# Patient Record
Sex: Female | Born: 1965 | Race: Black or African American | Hispanic: No | Marital: Single | Smoking: Former smoker
Health system: Southern US, Community
[De-identification: ages and names within clinical notes are randomized; demographics above are authoritative.]

## PROBLEM LIST (undated history)

## (undated) DIAGNOSIS — N938 Other specified abnormal uterine and vaginal bleeding: Secondary | ICD-10-CM

## (undated) DIAGNOSIS — G43909 Migraine, unspecified, not intractable, without status migrainosus: Secondary | ICD-10-CM

## (undated) DIAGNOSIS — R1110 Vomiting, unspecified: Secondary | ICD-10-CM

## (undated) HISTORY — DX: Migraine, unspecified, not intractable, without status migrainosus: G43.909

## (undated) HISTORY — PX: NO PAST SURGERIES: SHX2092

## (undated) HISTORY — PX: ENDOMETRIAL ABLATION: SHX621

## (undated) HISTORY — DX: Other specified abnormal uterine and vaginal bleeding: N93.8

---

## 2011-01-16 ENCOUNTER — Emergency Department (HOSPITAL_COMMUNITY)
Admission: EM | Admit: 2011-01-16 | Discharge: 2011-01-16 | Payer: Self-pay | Source: Home / Self Care | Admitting: Emergency Medicine

## 2011-01-16 LAB — DIFFERENTIAL
Eosinophils Relative: 0 % (ref 0–5)
Lymphocytes Relative: 11 % — ABNORMAL LOW (ref 12–46)
Monocytes Absolute: 0.8 10*3/uL (ref 0.1–1.0)
Monocytes Relative: 7 % (ref 3–12)
Neutrophils Relative %: 82 % — ABNORMAL HIGH (ref 43–77)

## 2011-01-16 LAB — POCT I-STAT, CHEM 8
BUN: 10 mg/dL (ref 6–23)
Chloride: 104 mEq/L (ref 96–112)
Creatinine, Ser: 0.9 mg/dL (ref 0.4–1.2)
Hemoglobin: 14.3 g/dL (ref 12.0–15.0)
Potassium: 3.6 mEq/L (ref 3.5–5.1)
Sodium: 139 mEq/L (ref 135–145)

## 2011-01-16 LAB — URINALYSIS, ROUTINE W REFLEX MICROSCOPIC
Bilirubin Urine: NEGATIVE
Hgb urine dipstick: NEGATIVE
Ketones, ur: NEGATIVE mg/dL
Protein, ur: NEGATIVE mg/dL
Urobilinogen, UA: 0.2 mg/dL (ref 0.0–1.0)

## 2011-01-16 LAB — CBC
Hemoglobin: 12.4 g/dL (ref 12.0–15.0)
MCV: 66.3 fL — ABNORMAL LOW (ref 78.0–100.0)
Platelets: 256 10*3/uL (ref 150–400)
RBC: 5.4 MIL/uL — ABNORMAL HIGH (ref 3.87–5.11)
WBC: 11.2 10*3/uL — ABNORMAL HIGH (ref 4.0–10.5)

## 2011-01-16 LAB — WET PREP, GENITAL: WBC, Wet Prep HPF POC: NONE SEEN

## 2011-01-17 LAB — GC/CHLAMYDIA PROBE AMP, GENITAL: Chlamydia, DNA Probe: NEGATIVE

## 2014-06-15 ENCOUNTER — Ambulatory Visit: Payer: Self-pay | Attending: Internal Medicine

## 2014-07-23 ENCOUNTER — Other Ambulatory Visit (HOSPITAL_COMMUNITY)
Admission: RE | Admit: 2014-07-23 | Discharge: 2014-07-23 | Disposition: A | Discharge status: Home / Self Care | Payer: No Typology Code available for payment source | Source: Ambulatory Visit | Attending: Internal Medicine | Admitting: Internal Medicine

## 2014-07-23 ENCOUNTER — Encounter: Payer: Self-pay | Admitting: Internal Medicine

## 2014-07-23 ENCOUNTER — Ambulatory Visit: Payer: No Typology Code available for payment source | Attending: Internal Medicine | Admitting: Internal Medicine

## 2014-07-23 VITALS — BP 117/74 | HR 85 | Temp 98.2°F | Resp 14 | Ht 62.0 in | Wt 117.4 lb

## 2014-07-23 DIAGNOSIS — R112 Nausea with vomiting, unspecified: Secondary | ICD-10-CM | POA: Insufficient documentation

## 2014-07-23 DIAGNOSIS — Z1272 Encounter for screening for malignant neoplasm of vagina: Secondary | ICD-10-CM | POA: Insufficient documentation

## 2014-07-23 DIAGNOSIS — Z0000 Encounter for general adult medical examination without abnormal findings: Secondary | ICD-10-CM

## 2014-07-23 DIAGNOSIS — Z01419 Encounter for gynecological examination (general) (routine) without abnormal findings: Secondary | ICD-10-CM | POA: Insufficient documentation

## 2014-07-23 DIAGNOSIS — N760 Acute vaginitis: Secondary | ICD-10-CM | POA: Insufficient documentation

## 2014-07-23 DIAGNOSIS — Z113 Encounter for screening for infections with a predominantly sexual mode of transmission: Secondary | ICD-10-CM | POA: Insufficient documentation

## 2014-07-23 DIAGNOSIS — Z87891 Personal history of nicotine dependence: Secondary | ICD-10-CM | POA: Insufficient documentation

## 2014-07-23 DIAGNOSIS — Z124 Encounter for screening for malignant neoplasm of cervix: Secondary | ICD-10-CM

## 2014-07-23 DIAGNOSIS — N926 Irregular menstruation, unspecified: Secondary | ICD-10-CM | POA: Insufficient documentation

## 2014-07-23 DIAGNOSIS — Z888 Allergy status to other drugs, medicaments and biological substances status: Secondary | ICD-10-CM | POA: Insufficient documentation

## 2014-07-23 DIAGNOSIS — Z1151 Encounter for screening for human papillomavirus (HPV): Secondary | ICD-10-CM | POA: Insufficient documentation

## 2014-07-23 LAB — POCT URINALYSIS DIPSTICK
Bilirubin, UA: NEGATIVE
GLUCOSE UA: NEGATIVE
Ketones, UA: NEGATIVE
LEUKOCYTES UA: NEGATIVE
Nitrite, UA: NEGATIVE
PROTEIN UA: NEGATIVE
SPEC GRAV UA: 1.025
UROBILINOGEN UA: 0.2
pH, UA: 5.5

## 2014-07-23 MED ORDER — ONDANSETRON 4 MG PO TBDP: 4.0000 mg | ORAL_TABLET | Freq: Three times a day (TID) | ORAL | Status: DC | PRN

## 2014-07-23 NOTE — Progress Notes (Signed)
Patient presents to establish care Requesting pap and breast exam States it's been at least 5 years since last pap. Denies history of abnormal pap C/O 2 year history of nausea and vomiting lasting 5-7 days during each month's cycle

## 2014-07-23 NOTE — Progress Notes (Signed)
Patient ID: Laura Guzman, female   DOB: 08/26/1966, 48 y.o.   MRN: [id number]   Laura Guzman, is a 48 y.o. female  CVE:[id number]  [id number]:[id number]  DOB - 1966-03-18  CC: "Wants pap and breast exam." Chief Complaint  Patient presents with  . Establish Care  . Gynecologic Exam       HPI: Laura Guzman is a 48 y.o. female here today to establish medical care. Laura Guzman presents for a pap-smear and breast exam. Patient reports a total of 3-4 paps last one was five years ago and all were normal. Patient reports having multiple cycles per month. Recently states having brown discharge with night sweats and hot flashes. Patient reports not being able to eat or drink for 5-7 days of her cycle with severe nausea and vomiting and weight loss. Patient is unable to give exact information but reports she used to be a size 9 and is now a size 3-5. No cramps or clotting or pain. No current birth control wants Depo-provera. Last sexual intercourse was November or December 2014. Past medical history of IBS, Crohns, diverticulitis, and multiple endoscopies. Reports father is in good health and mother has hypertension.  Allergies  Allergen Reactions  . Compazine [Prochlorperazine Edisylate] Swelling    States "jaw locked down"   History reviewed. No pertinent past medical history. No current outpatient prescriptions on file prior to visit.   No current facility-administered medications on file prior to visit.   Family History  Problem Relation Age of Onset  . Hypertension Mother   . Hyperlipidemia Mother    History   Social History  . Marital Status: Married    Spouse Name: N/A    Number of Children: N/A  . Years of Education: N/A   Occupational History  . Not on file.   Social History Main Topics  . Smoking status: Former Smoker -- 0.20 packs/day for 1 years    Types: Cigarettes    Start date: 07/23/2012    Quit date: 07/23/2013  . Smokeless tobacco: Not on file  . Alcohol Use:  Not on file  . Drug Use: Not on file  . Sexual Activity: Not on file   Other Topics Concern  . Not on file   Social History Narrative  . No narrative on file    Review of Systems  Constitutional: Positive for weight loss. Negative for fever, chills and malaise/fatigue.  HENT: Negative.   Eyes: Negative.   Respiratory: Negative.   Cardiovascular: Negative.   Gastrointestinal: Positive for nausea, vomiting and diarrhea. Negative for constipation.  Genitourinary: Negative.   Musculoskeletal: Negative.   Skin: Negative.   Neurological: Negative.   Endo/Heme/Allergies: Negative.   Psychiatric/Behavioral: The patient has insomnia.      Objective:   Filed Vitals:   07/23/14 1002  BP: 117/74  Pulse: 85  Temp: 98.2 F (36.8 C)  Resp: 14   Physical Exam  Constitutional: She is oriented to person, place, and time. Vital signs are normal. She appears well-developed and well-nourished.  HENT:  Head: Normocephalic.  Right Ear: Tympanic membrane, external ear and ear canal normal.  Left Ear: Tympanic membrane, external ear and ear canal normal.  Mouth/Throat: Uvula is midline and oropharynx is clear and moist.  Eyes: Conjunctivae, EOM and lids are normal. Pupils are equal, round, and reactive to light.  Neck: No thyromegaly present.  Cardiovascular: Normal rate, regular rhythm, S1 normal, S2 normal and normal heart sounds.   Pulmonary/Chest: Effort normal and breath sounds normal. Right  breast exhibits no inverted nipple, no mass, no nipple discharge, no skin change and no tenderness. Left breast exhibits no inverted nipple, no mass, no nipple discharge, no skin change and no tenderness. Breasts are symmetrical. There is no breast swelling.  Abdominal: Soft. Bowel sounds are normal.  Genitourinary: No breast bleeding.  Musculoskeletal: Normal range of motion.  Lymphadenopathy:       Head (right side): No submental, no submandibular, no tonsillar, no preauricular, no posterior  auricular and no occipital adenopathy present.       Head (left side): No submental, no submandibular, no tonsillar, no preauricular, no posterior auricular and no occipital adenopathy present.       Right cervical: No superficial cervical, no deep cervical and no posterior cervical adenopathy present.      Left cervical: No superficial cervical, no deep cervical and no posterior cervical adenopathy present.  Neurological: She is alert and oriented to person, place, and time. She has normal strength and normal reflexes. No cranial nerve deficit.  Skin: Skin is warm, dry and intact.  Psychiatric: She has a normal mood and affect. Her speech is normal and behavior is normal. Judgment and thought content normal. Cognition and memory are normal.     Lab Results  Component Value Date   WBC 11.2* 01/16/2011   HGB 14.3 01/16/2011   HCT 42.0 01/16/2011   MCV 66.3* 01/16/2011   PLT 256 01/16/2011   Lab Results  Component Value Date   CREATININE 0.9 01/16/2011   BUN 10 01/16/2011   NA 139 01/16/2011   K 3.6 01/16/2011   CL 104 01/16/2011    No results found for this basename: HGBA1C   Lipid Panel  No results found for this basename: chol, trig, hdl, cholhdl, vldl, ldlcalc       Assessment and plan:   Laura Guzman was seen today for establish care and gynecologic exam.  Diagnoses and associated orders for this visit:  Annual physical exam - Urinalysis Dipstick - MM Digital Screening; Future - Lipid panel; Future - CBC; Future - COMPLETE METABOLIC PANEL WITH GFR; Future - TSH; Future - Vitamin D, 25-hydroxy; Future - HIV antibody (with reflex); Future - RPR; Future  Papanicolaou smear - Cytology - PAP [place] - Cervicovaginal ancillary only - Cancel: HIV antibody (with reflex) - Cancel: RPR  Non-intractable vomiting with nausea, vomiting of unspecified type - ondansetron (ZOFRAN ODT) 4 MG disintegrating tablet; Take 1 tablet (4 mg total) by mouth every 8 (eight) hours as needed  for nausea or vomiting.  Irregular menstrual cycle - Ambulatory referral to Gynecology    Laura Guzman, [facility] [phone] 08/11/2014, 11:05 PM

## 2014-07-23 NOTE — Patient Instructions (Signed)

## 2014-07-24 LAB — CYTOLOGY - PAP

## 2014-07-27 ENCOUNTER — Other Ambulatory Visit: Payer: Self-pay | Admitting: Internal Medicine

## 2014-07-27 DIAGNOSIS — Z1231 Encounter for screening mammogram for malignant neoplasm of breast: Secondary | ICD-10-CM

## 2014-07-28 ENCOUNTER — Telehealth: Payer: Self-pay | Admitting: *Deleted

## 2014-07-28 ENCOUNTER — Telehealth: Payer: Self-pay | Admitting: Emergency Medicine

## 2014-07-28 MED ORDER — METRONIDAZOLE 500 MG PO TABS: 500.0000 mg | ORAL_TABLET | Freq: Two times a day (BID) | ORAL | Status: DC

## 2014-07-28 NOTE — Telephone Encounter (Signed)
Message copied by Ricci Barker on Tue Jul 28, 2014  4:45 PM ------      Message from: Chari Manning A      Created: Fri Jul 24, 2014  5:58 PM       Patient is positive for BV. Explain it is not a STD and should not drink alcohol while on this medication.  Please send script for metronidazole 500 mg BID for 7 days. No refills. Thanks ------

## 2014-07-28 NOTE — Telephone Encounter (Signed)
Pt given lab results with medication instructions Flagyl 500 mg BID x 7 dys and informed not to drink alcohol while on medication

## 2014-08-03 ENCOUNTER — Ambulatory Visit: Payer: No Typology Code available for payment source | Attending: Internal Medicine

## 2014-08-03 DIAGNOSIS — Z0000 Encounter for general adult medical examination without abnormal findings: Secondary | ICD-10-CM

## 2014-08-03 LAB — CBC
HEMATOCRIT: 37.4 % (ref 36.0–46.0)
HEMOGLOBIN: 12.1 g/dL (ref 12.0–15.0)
MCH: 22.6 pg — AB (ref 26.0–34.0)
MCHC: 32.4 g/dL (ref 30.0–36.0)
MCV: 69.8 fL — AB (ref 78.0–100.0)
Platelets: 273 10*3/uL (ref 150–400)
RBC: 5.36 MIL/uL — AB (ref 3.87–5.11)
RDW: 16.7 % — ABNORMAL HIGH (ref 11.5–15.5)
WBC: 5 10*3/uL (ref 4.0–10.5)

## 2014-08-03 LAB — COMPLETE METABOLIC PANEL WITH GFR
ALT: 21 U/L (ref 0–35)
AST: 21 U/L (ref 0–37)
Albumin: 4.2 g/dL (ref 3.5–5.2)
Alkaline Phosphatase: 61 U/L (ref 39–117)
BILIRUBIN TOTAL: 0.2 mg/dL (ref 0.2–1.2)
BUN: 13 mg/dL (ref 6–23)
CALCIUM: 9.1 mg/dL (ref 8.4–10.5)
CHLORIDE: 105 meq/L (ref 96–112)
CO2: 28 meq/L (ref 19–32)
CREATININE: 0.73 mg/dL (ref 0.50–1.10)
GLUCOSE: 84 mg/dL (ref 70–99)
Potassium: 4.4 mEq/L (ref 3.5–5.3)
Sodium: 140 mEq/L (ref 135–145)
Total Protein: 6.4 g/dL (ref 6.0–8.3)

## 2014-08-03 LAB — LIPID PANEL
CHOL/HDL RATIO: 2.5 ratio
CHOLESTEROL: 194 mg/dL (ref 0–200)
HDL: 79 mg/dL (ref >39)
LDL Cholesterol: 102 mg/dL — ABNORMAL HIGH (ref 0–99)
Triglycerides: 63 mg/dL (ref <150)
VLDL: 13 mg/dL (ref 0–40)

## 2014-08-04 ENCOUNTER — Telehealth: Payer: Self-pay | Admitting: *Deleted

## 2014-08-04 LAB — RPR

## 2014-08-04 LAB — HIV ANTIBODY (ROUTINE TESTING W REFLEX): HIV: NONREACTIVE

## 2014-08-04 LAB — VITAMIN D 25 HYDROXY (VIT D DEFICIENCY, FRACTURES): VIT D 25 HYDROXY: 15 ng/mL — AB (ref 30–89)

## 2014-08-04 LAB — TSH: TSH: 1.772 u[IU]/mL (ref 0.350–4.500)

## 2014-08-04 NOTE — Telephone Encounter (Signed)
Message copied by Velora Heckler on Tue Aug 04, 2014  5:38 PM ------      Message from: Chari Manning A      Created: Tue Jul 28, 2014 10:36 AM       Pap negative for malignancy. Will repeat in 3 years ------

## 2014-08-05 ENCOUNTER — Ambulatory Visit: Payer: No Typology Code available for payment source

## 2014-08-06 ENCOUNTER — Ambulatory Visit
Admission: RE | Admit: 2014-08-06 | Discharge: 2014-08-06 | Disposition: A | Discharge status: Home / Self Care | Payer: No Typology Code available for payment source | Source: Ambulatory Visit | Attending: Internal Medicine | Admitting: Internal Medicine

## 2014-08-06 ENCOUNTER — Telehealth: Payer: Self-pay | Admitting: Emergency Medicine

## 2014-08-06 DIAGNOSIS — Z1231 Encounter for screening mammogram for malignant neoplasm of breast: Secondary | ICD-10-CM

## 2014-08-06 MED ORDER — FERROUS SULFATE 325 (65 FE) MG PO TABS: 325.0000 mg | ORAL_TABLET | Freq: Every day | ORAL | Status: DC

## 2014-08-06 MED ORDER — VITAMIN D (ERGOCALCIFEROL) 1.25 MG (50000 UNIT) PO CAPS: 50000.0000 [IU] | ORAL_CAPSULE | ORAL | Status: DC

## 2014-08-06 NOTE — Telephone Encounter (Signed)
Pt given lab results with medications instructions to start taking Ferrous Sulfate 325 mg tab, Vitamin D 50,000 units weekly Pt given negative HIV/RPR results

## 2014-08-06 NOTE — Telephone Encounter (Signed)
Message copied by Ricci Barker on Thu Aug 06, 2014  2:03 PM ------      Message from: Chari Manning A      Created: Tue Aug 04, 2014 11:23 PM       Please send patient ferrous sulfate 325 mg QD with meal. Please send drisdol 50,000 IU to take once weekly for 12 weeks, no refills.  HIV/RPR is negative ------

## 2014-08-11 ENCOUNTER — Encounter: Payer: Self-pay | Admitting: Internal Medicine

## 2014-08-12 ENCOUNTER — Telehealth: Payer: Self-pay | Admitting: *Deleted

## 2014-08-12 NOTE — Telephone Encounter (Signed)
Pt is aware of her MM results. 

## 2014-08-12 NOTE — Telephone Encounter (Signed)
Message copied by Joan Mayans on Wed Aug 12, 2014  9:32 AM ------      Message from: Chari Manning A      Created: Tue Aug 11, 2014 12:56 PM       Mammogram is normal ------

## 2014-08-17 ENCOUNTER — Encounter: Payer: Self-pay | Admitting: Obstetrics and Gynecology

## 2014-10-02 ENCOUNTER — Ambulatory Visit (INDEPENDENT_AMBULATORY_CARE_PROVIDER_SITE_OTHER): Payer: Self-pay | Admitting: Obstetrics and Gynecology

## 2014-10-02 ENCOUNTER — Encounter: Payer: Self-pay | Admitting: Obstetrics and Gynecology

## 2014-10-02 ENCOUNTER — Other Ambulatory Visit (HOSPITAL_COMMUNITY)
Admission: RE | Admit: 2014-10-02 | Discharge: 2014-10-02 | Disposition: A | Discharge status: Home / Self Care | Payer: Self-pay | Source: Ambulatory Visit | Attending: Obstetrics and Gynecology | Admitting: Obstetrics and Gynecology

## 2014-10-02 VITALS — BP 125/63 | HR 62 | Temp 99.4°F | Ht 62.0 in | Wt 120.0 lb

## 2014-10-02 DIAGNOSIS — N938 Other specified abnormal uterine and vaginal bleeding: Secondary | ICD-10-CM | POA: Insufficient documentation

## 2014-10-02 LAB — POCT PREGNANCY, URINE: Preg Test, Ur: NEGATIVE

## 2014-10-02 NOTE — Progress Notes (Signed)
Patient ID: Laura Guzman, female   DOB: [birth year], 48 y.o.   MRN: [id number] 48 yo G3P2012 with LMP 09/17/2014 presenting for the evaluation of abnormal uterine bleeding associated with nausea/emesis. Patient reports skipping August and September and often having 2 periods a month. She describes her periods as lasting 5-7 days without being overly excessive and without passage of clots. She reports hot flushes and night sweats on occasions  History reviewed. No pertinent past medical history. History reviewed. No pertinent past surgical history. Family History  Problem Relation Age of Onset  . Hypertension Mother   . Hyperlipidemia Mother    History  Substance Use Topics  . Smoking status: Former Smoker -- 0.20 packs/day for 1 years    Types: Cigarettes    Start date: 07/23/2012    Quit date: 07/23/2013  . Smokeless tobacco: Never Used  . Alcohol Use: No    GENERAL: Well-developed, well-nourished female in no acute distress.  ABDOMEN: Soft, nontender, nondistended.  PELVIC: Normal external female genitalia. Vagina is pink and rugated.  Normal discharge. Normal appearing cervix. Uterus is normal in size. No adnexal mass or tenderness. EXTREMITIES: No cyanosis, clubbing, or edema, 2+ distal pulses.  A/P 48 yo [id number] with abnormal uterine bleeding Endometrial biopsy performed ENDOMETRIAL BIOPSY     The indications for endometrial biopsy were reviewed.   Risks of the biopsy including cramping, bleeding, infection, uterine perforation, inadequate specimen and need for additional procedures  were discussed. The patient states she understands and agrees to undergo procedure today. Consent was signed. Time out was performed. Urine HCG was negative. A sterile speculum was placed in the patient's vagina and the cervix was prepped with Betadine. A single-toothed tenaculum was placed on the anterior lip of the cervix to stabilize it. The uterine cavity was sounded to a depth of 7 cm using the  uterine sound. The 3 mm pipelle was introduced into the endometrial cavity without difficulty, 2 passes were made.  A  moderate amount of tissue was  sent to pathology. The instruments were removed from the patient's vagina. Minimal bleeding from the cervix was noted. The patient tolerated the procedure well.  Routine post-procedure instructions were given to the patient. The patient will follow up in two weeks to review the results and for further management.   -Discussed medical management with Depo-provera, Mirena IUD or continuous OCP - Patient seems to be inclining towards Depo-provera - RTC in 2 weeks

## 2014-10-02 NOTE — Patient Instructions (Signed)
Medroxyprogesterone injection [Contraceptive] What is this medicine? MEDROXYPROGESTERONE (me DROX ee proe JES te rone) contraceptive injections prevent pregnancy. They provide effective birth control for 3 months. Depo-subQ Provera 104 is also used for treating pain related to endometriosis. This medicine may be used for other purposes; ask your health care provider or pharmacist if you have questions. COMMON BRAND NAME(S): Depo-Provera, Depo-subQ Provera 104 What should I tell my health care provider before I take this medicine? They need to know if you have any of these conditions: -frequently drink alcohol -asthma -blood vessel disease or a history of a blood clot in the lungs or legs -bone disease such as osteoporosis -breast cancer -diabetes -eating disorder (anorexia nervosa or bulimia) -high blood pressure -HIV infection or AIDS -kidney disease -liver disease -mental depression -migraine -seizures (convulsions) -stroke -tobacco smoker -vaginal bleeding -an unusual or allergic reaction to medroxyprogesterone, other hormones, medicines, foods, dyes, or preservatives -pregnant or trying to get pregnant -breast-feeding How should I use this medicine? Depo-Provera Contraceptive injection is given into a muscle. Depo-subQ Provera 104 injection is given under the skin. These injections are given by a health care professional. You must not be pregnant before getting an injection. The injection is usually given during the first 5 days after the start of a menstrual period or 6 weeks after delivery of a baby. Talk to your pediatrician regarding the use of this medicine in children. Special care may be needed. These injections have been used in female children who have started having menstrual periods. Overdosage: If you think you have taken too much of this medicine contact a poison control center or emergency room at once. NOTE: This medicine is only for you. Do not share this medicine  with others. What if I miss a dose? Try not to miss a dose. You must get an injection once every 3 months to maintain birth control. If you cannot keep an appointment, call and reschedule it. If you wait longer than 13 weeks between Depo-Provera contraceptive injections or longer than 14 weeks between Depo-subQ Provera 104 injections, you could get pregnant. Use another method for birth control if you miss your appointment. You may also need a pregnancy test before receiving another injection. What may interact with this medicine? Do not take this medicine with any of the following medications: -bosentan This medicine may also interact with the following medications: -aminoglutethimide -antibiotics or medicines for infections, especially rifampin, rifabutin, rifapentine, and griseofulvin -aprepitant -barbiturate medicines such as phenobarbital or primidone -bexarotene -carbamazepine -medicines for seizures like ethotoin, felbamate, oxcarbazepine, phenytoin, topiramate -modafinil -St. John's wort This list may not describe all possible interactions. Give your health care provider a list of all the medicines, herbs, non-prescription drugs, or dietary supplements you use. Also tell them if you smoke, drink alcohol, or use illegal drugs. Some items may interact with your medicine. What should I watch for while using this medicine? This drug does not protect you against HIV infection (AIDS) or other sexually transmitted diseases. Use of this product may cause you to lose calcium from your bones. Loss of calcium may cause weak bones (osteoporosis). Only use this product for more than 2 years if other forms of birth control are not right for you. The longer you use this product for birth control the more likely you will be at risk for weak bones. Ask your health care professional how you can keep strong bones. You may have a change in bleeding pattern or irregular periods. Many females stop having    periods while taking this drug. If you have received your injections on time, your chance of being pregnant is very low. If you think you may be pregnant, see your health care professional as soon as possible. Tell your health care professional if you want to get pregnant within the next year. The effect of this medicine may last a long time after you get your last injection. What side effects may I notice from receiving this medicine? Side effects that you should report to your doctor or health care professional as soon as possible: -allergic reactions like skin rash, itching or hives, swelling of the face, lips, or tongue -breast tenderness or discharge -breathing problems -changes in vision -depression -feeling faint or lightheaded, falls -fever -pain in the abdomen, chest, groin, or leg -problems with balance, talking, walking -unusually weak or tired -yellowing of the eyes or skin Side effects that usually do not require medical attention (report to your doctor or health care professional if they continue or are bothersome): -acne -fluid retention and swelling -headache -irregular periods, spotting, or absent periods -temporary pain, itching, or skin reaction at site where injected -weight gain This list may not describe all possible side effects. Call your doctor for medical advice about side effects. You may report side effects to FDA at 1-800-FDA-1088. Where should I keep my medicine? This does not apply. The injection will be given to you by a health care professional. NOTE: This sheet is a summary. It may not cover all possible information. If you have questions about this medicine, talk to your doctor, pharmacist, or health care provider.  2015, Elsevier/Gold Standard. (2008-12-25 18:37:56) Levonorgestrel intrauterine device (IUD) What is this medicine? LEVONORGESTREL IUD (LEE voe nor jes trel) is a contraceptive (birth control) device. The device is placed inside the uterus  by a healthcare professional. It is used to prevent pregnancy and can also be used to treat heavy bleeding that occurs during your period. Depending on the device, it can be used for 3 to 5 years. This medicine may be used for other purposes; ask your health care provider or pharmacist if you have questions. COMMON BRAND NAME(S): Verda Cumins What should I tell my health care provider before I take this medicine? They need to know if you have any of these conditions: -abnormal Pap smear -cancer of the breast, uterus, or cervix -diabetes -endometritis -genital or pelvic infection now or in the past -have more than one sexual partner or your partner has more than one partner -heart disease -history of an ectopic or tubal pregnancy -immune system problems -IUD in place -liver disease or tumor -problems with blood clots or take blood-thinners -use intravenous drugs -uterus of unusual shape -vaginal bleeding that has not been explained -an unusual or allergic reaction to levonorgestrel, other hormones, silicone, or polyethylene, medicines, foods, dyes, or preservatives -pregnant or trying to get pregnant -breast-feeding How should I use this medicine? This device is placed inside the uterus by a health care professional. Talk to your pediatrician regarding the use of this medicine in children. Special care may be needed. Overdosage: If you think you have taken too much of this medicine contact a poison control center or emergency room at once. NOTE: This medicine is only for you. Do not share this medicine with others. What if I miss a dose? This does not apply. What may interact with this medicine? Do not take this medicine with any of the following medications: -amprenavir -bosentan -fosamprenavir This medicine may also interact  with the following medications: -aprepitant -barbiturate medicines for inducing sleep or treating seizures -bexarotene -griseofulvin -medicines  to treat seizures like carbamazepine, ethotoin, felbamate, oxcarbazepine, phenytoin, topiramate -modafinil -pioglitazone -rifabutin -rifampin -rifapentine -some medicines to treat HIV infection like atazanavir, indinavir, lopinavir, nelfinavir, tipranavir, ritonavir -St. John's wort -warfarin This list may not describe all possible interactions. Give your health care provider a list of all the medicines, herbs, non-prescription drugs, or dietary supplements you use. Also tell them if you smoke, drink alcohol, or use illegal drugs. Some items may interact with your medicine. What should I watch for while using this medicine? Visit your doctor or health care professional for regular check ups. See your doctor if you or your partner has sexual contact with others, becomes HIV positive, or gets a sexual transmitted disease. This product does not protect you against HIV infection (AIDS) or other sexually transmitted diseases. You can check the placement of the IUD yourself by reaching up to the top of your vagina with clean fingers to feel the threads. Do not pull on the threads. It is a good habit to check placement after each menstrual period. Call your doctor right away if you feel more of the IUD than just the threads or if you cannot feel the threads at all. The IUD may come out by itself. You may become pregnant if the device comes out. If you notice that the IUD has come out use a backup birth control method like condoms and call your health care provider. Using tampons will not change the position of the IUD and are okay to use during your period. What side effects may I notice from receiving this medicine? Side effects that you should report to your doctor or health care professional as soon as possible: -allergic reactions like skin rash, itching or hives, swelling of the face, lips, or tongue -fever, flu-like symptoms -genital sores -high blood pressure -no menstrual period for 6 weeks  during use -pain, swelling, warmth in the leg -pelvic pain or tenderness -severe or sudden headache -signs of pregnancy -stomach cramping -sudden shortness of breath -trouble with balance, talking, or walking -unusual vaginal bleeding, discharge -yellowing of the eyes or skin Side effects that usually do not require medical attention (report to your doctor or health care professional if they continue or are bothersome): -acne -breast pain -change in sex drive or performance -changes in weight -cramping, dizziness, or faintness while the device is being inserted -headache -irregular menstrual bleeding within first 3 to 6 months of use -nausea This list may not describe all possible side effects. Call your doctor for medical advice about side effects. You may report side effects to FDA at 1-800-FDA-1088. Where should I keep my medicine? This does not apply. NOTE: This sheet is a summary. It may not cover all possible information. If you have questions about this medicine, talk to your doctor, pharmacist, or health care provider.  2015, Elsevier/Gold Standard. (2012-01-04 13:54:04)

## 2014-10-08 ENCOUNTER — Telehealth: Payer: Self-pay | Admitting: *Deleted

## 2014-10-08 NOTE — Telephone Encounter (Signed)
Contacted patient and informed of results, encouraged patient to keep follow up visit for DUB.  Pt verbalizes understanding.

## 2014-10-08 NOTE — Telephone Encounter (Signed)
Message copied by Sue Lush on Thu Oct 08, 2014 11:31 AM ------      Message from: CONSTANT, Willow      Created: Thu Oct 08, 2014 10:46 AM       Please inform patient of normal endometrial biopsy results. Patient should keep follow up visit for medical management of her DUB            Thanks            Peggy ------

## 2014-10-19 ENCOUNTER — Encounter: Payer: Self-pay | Admitting: Obstetrics and Gynecology

## 2014-10-26 ENCOUNTER — Ambulatory Visit (INDEPENDENT_AMBULATORY_CARE_PROVIDER_SITE_OTHER): Payer: Self-pay | Admitting: Obstetrics and Gynecology

## 2014-10-26 ENCOUNTER — Encounter: Payer: Self-pay | Admitting: Obstetrics and Gynecology

## 2014-10-26 VITALS — BP 124/71 | HR 77 | Temp 98.5°F | Ht 62.0 in | Wt 125.4 lb

## 2014-10-26 DIAGNOSIS — Z712 Person consulting for explanation of examination or test findings: Secondary | ICD-10-CM

## 2014-10-26 DIAGNOSIS — Z7189 Other specified counseling: Secondary | ICD-10-CM

## 2014-10-26 MED ORDER — NORGESTIMATE-ETH ESTRADIOL 0.25-35 MG-MCG PO TABS: 1.0000 | ORAL_TABLET | Freq: Every day | ORAL | Status: DC

## 2014-10-26 NOTE — Progress Notes (Signed)
Patient ID: Laura Guzman, female   DOB: [birth year], 48 y.o.   MRN: [id number] Patient presents today to discuss the results of her endometrial biopsy. Results reviewed and explained to the patient  Diagnosis Endometrium, biopsy 10/02/2014 - PROLIFERATIVE ENDOMETRIUM. NO HYPERPLASIA OR CARCINOMA.  A/P 48 yo with most likely perimenopausal dysfunctional vaginal bleeding - Discussed medical management with OCP, depo-provera or Mirena IUD Patient opted for OCP as she will be relocating to [place]. She plans on switching to depo-Provera once she establishes care in [place] - rtc prn

## 2014-10-30 ENCOUNTER — Telehealth: Payer: Self-pay | Admitting: *Deleted

## 2014-10-30 NOTE — Telephone Encounter (Addendum)
Pt left message stating that she is having a problem with the prescription that was sent to her pharmacy. She was given 12 refills but the pharmacy will only give her a 3 month supply and [place].  She wants to know what she can do. I returned pt's call and discussed her concern. I advised her that she can have her remaining prescription refills transferred to her new pharmacy once she has moved.  Pt expressed gratitude and voiced understanding

## 2014-12-03 ENCOUNTER — Ambulatory Visit: Payer: No Typology Code available for payment source | Attending: Internal Medicine | Admitting: Internal Medicine

## 2014-12-03 ENCOUNTER — Encounter: Payer: Self-pay | Admitting: Internal Medicine

## 2014-12-03 ENCOUNTER — Ambulatory Visit (HOSPITAL_BASED_OUTPATIENT_CLINIC_OR_DEPARTMENT_OTHER): Payer: No Typology Code available for payment source | Admitting: *Deleted

## 2014-12-03 ENCOUNTER — Other Ambulatory Visit (HOSPITAL_COMMUNITY)
Admission: RE | Admit: 2014-12-03 | Discharge: 2014-12-03 | Disposition: A | Discharge status: Home / Self Care | Payer: No Typology Code available for payment source | Source: Ambulatory Visit | Attending: Internal Medicine | Admitting: Internal Medicine

## 2014-12-03 VITALS — BP 136/90 | HR 88 | Temp 98.4°F | Resp 16 | Ht 62.0 in | Wt 129.0 lb

## 2014-12-03 DIAGNOSIS — N760 Acute vaginitis: Secondary | ICD-10-CM | POA: Insufficient documentation

## 2014-12-03 DIAGNOSIS — Z23 Encounter for immunization: Secondary | ICD-10-CM

## 2014-12-03 DIAGNOSIS — Z113 Encounter for screening for infections with a predominantly sexual mode of transmission: Secondary | ICD-10-CM | POA: Insufficient documentation

## 2014-12-03 DIAGNOSIS — Z87891 Personal history of nicotine dependence: Secondary | ICD-10-CM | POA: Insufficient documentation

## 2014-12-03 DIAGNOSIS — N938 Other specified abnormal uterine and vaginal bleeding: Secondary | ICD-10-CM | POA: Insufficient documentation

## 2014-12-03 LAB — POCT URINALYSIS DIPSTICK
BILIRUBIN UA: NEGATIVE
Glucose, UA: NEGATIVE
Ketones, UA: NEGATIVE
Nitrite, UA: NEGATIVE
Protein, UA: NEGATIVE
SPEC GRAV UA: 1.015
Urobilinogen, UA: 0.2
pH, UA: 7

## 2014-12-03 MED ORDER — METRONIDAZOLE 0.75 % VA GEL: 1.0000 | Freq: Two times a day (BID) | VAGINAL | Status: DC

## 2014-12-03 NOTE — Patient Instructions (Signed)
Bacterial Vaginosis Bacterial vaginosis is a vaginal infection that occurs when the normal balance of bacteria in the vagina is disrupted. It results from an overgrowth of certain bacteria. This is the most common vaginal infection in women of childbearing age. Treatment is important to prevent complications, especially in pregnant women, as it can cause a premature delivery. CAUSES  Bacterial vaginosis is caused by an increase in harmful bacteria that are normally present in smaller amounts in the vagina. Several different kinds of bacteria can cause bacterial vaginosis. However, the reason that the condition develops is not fully understood. RISK FACTORS Certain activities or behaviors can put you at an increased risk of developing bacterial vaginosis, including:  Having a new sex partner or multiple sex partners.  Douching.  Using an intrauterine device (IUD) for contraception. Women do not get bacterial vaginosis from toilet seats, bedding, swimming pools, or contact with objects around them. SIGNS AND SYMPTOMS  Some women with bacterial vaginosis have no signs or symptoms. Common symptoms include:  Grey vaginal discharge.  A fishlike odor with discharge, especially after sexual intercourse.  Itching or burning of the vagina and vulva.  Burning or pain with urination. DIAGNOSIS  Your health care provider will take a medical history and examine the vagina for signs of bacterial vaginosis. A sample of vaginal fluid may be taken. Your health care provider will look at this sample under a microscope to check for bacteria and abnormal cells. A vaginal pH test may also be done.  TREATMENT  Bacterial vaginosis may be treated with antibiotic medicines. These may be given in the form of a pill or a vaginal cream. A second round of antibiotics may be prescribed if the condition comes back after treatment.  HOME CARE INSTRUCTIONS   Only take over-the-counter or prescription medicines as  directed by your health care provider.  If antibiotic medicine was prescribed, take it as directed. Make sure you finish it even if you start to feel better.  Do not have sex until treatment is completed.  Tell all sexual partners that you have a vaginal infection. They should see their health care provider and be treated if they have problems, such as a mild rash or itching.  Practice safe sex by using condoms and only having one sex partner. SEEK MEDICAL CARE IF:   Your symptoms are not improving after 3 days of treatment.  You have increased discharge or pain.  You have a fever. MAKE SURE YOU:   Understand these instructions.  Will watch your condition.  Will get help right away if you are not doing well or get worse. FOR MORE INFORMATION  Centers for Disease Control and Prevention, Division of STD Prevention: www.cdc.gov/std American Sexual Health Association (ASHA): www.ashastd.org  Document Released: 12/04/2005 Document Revised: 09/24/2013 Document Reviewed: 07/16/2013 ExitCare Patient Information 2015 [employer]. This information is not intended to replace advice given to you by your health care provider. Make sure you discuss any questions you have with your health care provider.  

## 2014-12-03 NOTE — Progress Notes (Signed)
Patient ID: Laura Guzman, female   DOB: 02-22-1966, 48 y.o.   MRN: [id number]  CC: vaginal discharge    HPI: Laura Guzman is a 48 y.o. female here today for a follow up visit.  Patient has past medical history of dysfunctional uterine bleeding and has been started on OCP.  She states that she has been having vaginal discharge and irritation for the past two weeks.  She states that she used a different soap and believes that is what caused her irritation.  Some odor, itchy, burning.  No pain or pressure with urination.  She denies recent sexual intercourse.  Patient has No headache, No chest pain, No abdominal pain - No Nausea, No new weakness tingling or numbness, No Cough - SOB.  Allergies  Allergen Reactions  . Compazine [Prochlorperazine Edisylate] Swelling    States "jaw locked down"   History reviewed. No pertinent past medical history. Current Outpatient Prescriptions on File Prior to Visit  Medication Sig Dispense Refill  . ferrous sulfate 325 (65 FE) MG tablet Take 1 tablet (325 mg total) by mouth daily with breakfast. 90 tablet 3  . norgestimate-ethinyl estradiol (ORTHO-CYCLEN,SPRINTEC,PREVIFEM) 0.25-35 MG-MCG tablet Take 1 tablet by mouth daily. 1 Package 11  . Vitamin D, Ergocalciferol, (DRISDOL) 50000 UNITS CAPS capsule Take 1 capsule (50,000 Units total) by mouth every 7 (seven) days. 12 capsule 0   No current facility-administered medications on file prior to visit.   Family History  Problem Relation Age of Onset  . Hypertension Mother   . Hyperlipidemia Mother    History   Social History  . Marital Status: Married    Spouse Name: N/A    Number of Children: N/A  . Years of Education: N/A   Occupational History  . Not on file.   Social History Main Topics  . Smoking status: Former Smoker -- 0.20 packs/day for 1 years    Types: Cigarettes    Start date: 07/23/2012    Quit date: 07/23/2013  . Smokeless tobacco: Never Used  . Alcohol Use: No  . Drug Use: No   . Sexual Activity: Yes    Birth Control/ Protection: None   Other Topics Concern  . Not on file   Social History Narrative    Review of Systems  Genitourinary: Negative for dysuria, urgency, frequency, hematuria and flank pain.  All other systems reviewed and are negative.      Objective:   Filed Vitals:   12/03/14 1215  BP: 148/82  Pulse: 88  Temp: 98.4 F (36.9 C)  Resp: 16    Physical Exam  Constitutional: She is oriented to person, place, and time.  Cardiovascular: Normal rate, regular rhythm and normal heart sounds.   Pulmonary/Chest: Effort normal and breath sounds normal.  Abdominal: Soft. Bowel sounds are normal. She exhibits no distension. There is no tenderness.  Genitourinary: Uterus normal. Cervix exhibits no motion tenderness, no discharge and no friability. Right adnexum displays no mass and no tenderness. Left adnexum displays no mass and no tenderness. Vaginal discharge found.  Lymphadenopathy:       Right: No inguinal adenopathy present.       Left: No inguinal adenopathy present.  Neurological: She is alert and oriented to person, place, and time.  Skin: Skin is warm and dry.  Psychiatric: She has a normal mood and affect.     Lab Results  Component Value Date   WBC 5.0 08/03/2014   HGB 12.1 08/03/2014   HCT 37.4 08/03/2014   MCV  69.8* 08/03/2014   PLT 273 08/03/2014   Lab Results  Component Value Date   CREATININE 0.73 08/03/2014   BUN 13 08/03/2014   NA 140 08/03/2014   K 4.4 08/03/2014   CL 105 08/03/2014   CO2 28 08/03/2014    No results found for: HGBA1C Lipid Panel     Component Value Date/Time   CHOL 194 08/03/2014 0932   TRIG 63 08/03/2014 0932   HDL 79 08/03/2014 0932   CHOLHDL 2.5 08/03/2014 0932   VLDL 13 08/03/2014 0932   LDLCALC 102* 08/03/2014 0932       Assessment and plan:   Laura Guzman was seen today for follow-up.  Diagnoses and associated orders for this visit:  Vaginitis - Urinalysis  Dipstick - metroNIDAZOLE (METROGEL VAGINAL) 0.75 % vaginal gel; Place 1 Applicatorful vaginally 2 (two) times daily. For 5 days - Cervicovaginal ancillary only   Return if symptoms worsen or fail to improve.        Chari Manning, NP-C [facility] [phone] 12/03/2014, 12:28 PM

## 2014-12-03 NOTE — Progress Notes (Signed)
Pt is here today b/c she is she is having irritation and discharge in her vaginal area. Pt thinks that it comes from using a new soap. Pt states that she has not been sexually active.

## 2014-12-04 ENCOUNTER — Encounter: Payer: Self-pay | Admitting: *Deleted

## 2014-12-04 LAB — CERVICOVAGINAL ANCILLARY ONLY
CHLAMYDIA, DNA PROBE: NEGATIVE
Neisseria Gonorrhea: NEGATIVE
WET PREP (BD AFFIRM): POSITIVE — AB
Wet Prep (BD Affirm): NEGATIVE
Wet Prep (BD Affirm): POSITIVE — AB

## 2014-12-08 ENCOUNTER — Telehealth: Payer: Self-pay | Admitting: Internal Medicine

## 2014-12-08 ENCOUNTER — Other Ambulatory Visit: Payer: Self-pay | Admitting: Internal Medicine

## 2014-12-08 DIAGNOSIS — A599 Trichomoniasis, unspecified: Secondary | ICD-10-CM

## 2014-12-08 MED ORDER — METRONIDAZOLE 500 MG PO TABS: 2000.0000 mg | ORAL_TABLET | Freq: Once | ORAL | Status: DC

## 2014-12-08 NOTE — Telephone Encounter (Signed)
Contacted patient regarding pelvic exam results.  Patient explained that is positive for bacterial vaginosis and trichomonas.  Patient was previously treated with MetroGel for 5 days, explained to patient that I will send a prescription for Flagyl 2 g to take once by mouth for treatment of trichomonas. Explained to patient that trichomonas is not a STD and that she will need to inform all partners of her status for treatment.

## 2014-12-30 ENCOUNTER — Telehealth: Payer: Self-pay | Admitting: Internal Medicine

## 2014-12-30 NOTE — Telephone Encounter (Signed)
Patient called stating that she is sick and is vomiting. Patient states that she gets sick during her menstruation but would like to get a Rx to treat nausea. Patient is currently out of state in [place] and would like medication sent to [facility]. Harle Stanford [phone]. Please f/u with pt.

## 2014-12-30 NOTE — Telephone Encounter (Signed)
Pt advice to go to urgent care for evaluation

## 2015-01-28 ENCOUNTER — Telehealth: Payer: Self-pay | Admitting: Internal Medicine

## 2015-01-28 NOTE — Telephone Encounter (Signed)
Pt is calling stating that her medication is not working. Would like to speak to nurse, please f/u with pt

## 2015-01-28 NOTE — Telephone Encounter (Signed)
Pt stated has question about birth control pill Advised to call OBGYN

## 2015-01-29 NOTE — Telephone Encounter (Signed)
Please find out exactly what patient is requesting. She should hae plenty of birth control pills remaining, if she is continue to have bleeding with pills she will need to call OBGYN for further instructions. Thanks

## 2015-02-04 ENCOUNTER — Telehealth: Payer: Self-pay

## 2015-02-04 NOTE — Telephone Encounter (Signed)
Patient called stating she was placed on OCP's for her cycle but has since experienced two full periods in January and February and wants to know if a stronger RX needs to be prescribed. Called patient to discuss how she has been taking pills-- patient reports she has been taking pill continuously-- after third week starts a new pill pack (skips placebo pills) to avoid period all together. Reports she began taking them on October and did not have a period in November or December though states she had a heavy period during the third week of pills in both January and February-- thus experiencing N/V associated with her periods. Denies ever missing any pills and states she takes each pill at exactly the same time every day. She is living in [facility] now but has not obtained a new GYN. Informed patient I would send Dr. Elly Modena a message and call her when a response is received. Patient verbalized understanding and gratitude, no further questions or concerns.

## 2015-02-04 NOTE — Telephone Encounter (Signed)
Pt was given GYN phone number Stated had a full cycle and just ended Will call GYN

## 2015-02-05 NOTE — Telephone Encounter (Signed)
Called patient and informed her of Dr. Domenic Schwab orders/recomendations. Patient verbalized understanding to all. No further questions or concerns.

## 2015-02-05 NOTE — Telephone Encounter (Signed)
-----   Message from Mora Bellman, MD sent at 02/05/2015  7:48 AM EST ----- She does not need a stronger pill. She can continue taking the pills continuously as she has been with the exception that she needs to allow herself to have a period every 3 months. So she should take this current pack or the next one in its entirety, including the placebo pill (allowing herself to have a normal period) and the she can return to taking 3 packs continuously (skipping the placebo pills) and allowing herself to have a period on the 3rd pack by taking the placebo pills  Let me know if you have any further questions  Thanks  Peggy ----- Message -----    From: Geanie Logan, RN    Sent: 02/04/2015  10:33 AM      To: Mora Bellman, MD  Hi Dr. Elly Modena,   Patient called stating she has been taking OCP's as prescribed since October 2015 for DUB. Reports she skips placebo pills and begins another pack. Was fine until January and this month-- has had a period during the third week of pills each month (also experiencing her N/V associated with her periods). She is requesting a stronger pill. She has also moved to [facility] and has not obtained new GYN yet. Please advise.   Thank you,  Lovena Le

## 2015-03-22 ENCOUNTER — Ambulatory Visit: Payer: Self-pay | Admitting: Internal Medicine

## 2015-03-25 ENCOUNTER — Ambulatory Visit: Payer: Self-pay | Admitting: Internal Medicine

## 2015-04-13 ENCOUNTER — Encounter: Payer: Self-pay | Admitting: Internal Medicine

## 2015-04-13 ENCOUNTER — Ambulatory Visit: Payer: Self-pay | Attending: Internal Medicine | Admitting: Internal Medicine

## 2015-04-13 VITALS — BP 135/86 | HR 94 | Temp 98.0°F | Resp 16 | Ht 62.0 in | Wt 121.0 lb

## 2015-04-13 DIAGNOSIS — N939 Abnormal uterine and vaginal bleeding, unspecified: Secondary | ICD-10-CM

## 2015-04-13 DIAGNOSIS — Z87891 Personal history of nicotine dependence: Secondary | ICD-10-CM | POA: Insufficient documentation

## 2015-04-13 DIAGNOSIS — Z793 Long term (current) use of hormonal contraceptives: Secondary | ICD-10-CM | POA: Insufficient documentation

## 2015-04-13 DIAGNOSIS — N938 Other specified abnormal uterine and vaginal bleeding: Secondary | ICD-10-CM | POA: Insufficient documentation

## 2015-04-13 HISTORY — DX: Abnormal uterine and vaginal bleeding, unspecified: N93.9

## 2015-04-13 LAB — POCT URINALYSIS DIPSTICK
Glucose, UA: NEGATIVE
Leukocytes, UA: NEGATIVE
NITRITE UA: NEGATIVE
Protein, UA: 30
Urobilinogen, UA: 0.2
pH, UA: 5.5

## 2015-04-13 LAB — POCT URINE PREGNANCY: Preg Test, Ur: NEGATIVE

## 2015-04-13 MED ORDER — MEDROXYPROGESTERONE ACETATE 150 MG/ML IM SUSP
150.0000 mg | Freq: Once | INTRAMUSCULAR | Status: AC
  Administered 2015-04-13: 150 mg via INTRAMUSCULAR

## 2015-04-13 NOTE — Progress Notes (Signed)
Pt is here following up on her abdomen pain that starts on her monthly cycles. Pt was placed on birth control to minimize her cycles. Pt reports that the pain started back so she quit taking the birth control medication.

## 2015-04-13 NOTE — Progress Notes (Signed)
Patient ID: Laura Guzman, female   DOB: 10-30-1966, 49 y.o.   MRN: [id number]  CC: abdominal pain   HPI: Laura Guzman is a 49 y.o. female here today for a follow up visit.  Patient presents to clinic with concerns of abdominal pain, nausea, and vomiting around her menstrual cycle. She was seen by GYN last year and was diagnosed with DUB accompanied by a endometrial biopsy. She was begun on OCP but reports that she stopped taking the medication 2 weeks ago because she continued to have pain and bleeding. She states that she continues to bleed for 5 days.   Patient has No headache, No chest pain, No new weakness tingling or numbness, No Cough - SOB.  Allergies  Allergen Reactions  . Compazine [Prochlorperazine Edisylate] Swelling    States "jaw locked down"   History reviewed. No pertinent past medical history. Current Outpatient Prescriptions on File Prior to Visit  Medication Sig Dispense Refill  . norgestimate-ethinyl estradiol (ORTHO-CYCLEN,SPRINTEC,PREVIFEM) 0.25-35 MG-MCG tablet Take 1 tablet by mouth daily. 1 Package 11  . ferrous sulfate 325 (65 FE) MG tablet Take 1 tablet (325 mg total) by mouth daily with breakfast. (Patient not taking: Reported on 04/13/2015) 90 tablet 3  . metroNIDAZOLE (FLAGYL) 500 MG tablet Take 4 tablets (2,000 mg total) by mouth once. (Patient not taking: Reported on 04/13/2015) 4 tablet 0  . metroNIDAZOLE (METROGEL VAGINAL) 0.75 % vaginal gel Place 1 Applicatorful vaginally 2 (two) times daily. For 5 days (Patient not taking: Reported on 04/13/2015) 70 g 0  . Vitamin D, Ergocalciferol, (DRISDOL) 50000 UNITS CAPS capsule Take 1 capsule (50,000 Units total) by mouth every 7 (seven) days. 12 capsule 0   No current facility-administered medications on file prior to visit.   Family History  Problem Relation Age of Onset  . Hypertension Mother   . Hyperlipidemia Mother    History   Social History  . Marital Status: Married    Spouse Name: N/A  . Number of  Children: N/A  . Years of Education: N/A   Occupational History  . Not on file.   Social History Main Topics  . Smoking status: Former Smoker -- 0.20 packs/day for 1 years    Types: Cigarettes    Start date: 07/23/2012    Quit date: 07/23/2013  . Smokeless tobacco: Never Used  . Alcohol Use: No  . Drug Use: No  . Sexual Activity: Yes    Birth Control/ Protection: None   Other Topics Concern  . Not on file   Social History Narrative    Review of Systems: See HPI  Objective:   Filed Vitals:   04/13/15 1051  BP: 135/86  Pulse: 94  Temp: 98 F (36.7 C)  Resp: 16    Physical Exam  Constitutional: She is oriented to person, place, and time.  Cardiovascular: Normal rate, regular rhythm and normal heart sounds.   Pulmonary/Chest: Effort normal and breath sounds normal.  Abdominal: Soft. Bowel sounds are normal. There is no tenderness.  Neurological: She is alert and oriented to person, place, and time.  Skin: Skin is warm and dry.     Lab Results  Component Value Date   WBC 5.0 08/03/2014   HGB 12.1 08/03/2014   HCT 37.4 08/03/2014   MCV 69.8* 08/03/2014   PLT 273 08/03/2014   Lab Results  Component Value Date   CREATININE 0.73 08/03/2014   BUN 13 08/03/2014   NA 140 08/03/2014   K 4.4 08/03/2014   CL  105 08/03/2014   CO2 28 08/03/2014    No results found for: HGBA1C Lipid Panel     Component Value Date/Time   CHOL 194 08/03/2014 0932   TRIG 63 08/03/2014 0932   HDL 79 08/03/2014 0932   CHOLHDL 2.5 08/03/2014 0932   VLDL 13 08/03/2014 0932   LDLCALC 102* 08/03/2014 0932       Assessment and plan:   Laura Guzman was seen today for follow-up.  Diagnoses and all orders for this visit:  DUB (dysfunctional uterine bleeding) Orders: -     Urinalysis Dipstick -     POCT urine pregnancy -     medroxyPROGESTERone (DEPO-PROVERA) injection 150 mg; Inject 1 mL (150 mg total) into the muscle once. D/c OCP and switched to depo-provera to see if that will  help patients bleeding. If patient does not see improvement she will be referred back to GYN to possibly look at options of hysterectomy.   Return for 7/12-7/26 Depo inject with RN .       Chari Manning, [place] [facility] [phone] 04/13/2015, 10:59 AM

## 2015-04-19 ENCOUNTER — Emergency Department (HOSPITAL_COMMUNITY)
Admission: EM | Admit: 2015-04-19 | Discharge: 2015-04-20 | Disposition: A | Discharge status: Home / Self Care | Payer: Self-pay | Attending: Emergency Medicine | Admitting: Emergency Medicine

## 2015-04-19 ENCOUNTER — Ambulatory Visit (HOSPITAL_COMMUNITY)
Admission: RE | Admit: 2015-04-19 | Discharge: 2015-04-19 | Disposition: A | Discharge status: Home / Self Care | Payer: Self-pay | Source: Ambulatory Visit | Attending: Cardiology | Admitting: Cardiology

## 2015-04-19 ENCOUNTER — Ambulatory Visit: Payer: Self-pay | Attending: Internal Medicine | Admitting: Internal Medicine

## 2015-04-19 ENCOUNTER — Encounter: Payer: Self-pay | Admitting: Internal Medicine

## 2015-04-19 ENCOUNTER — Encounter (HOSPITAL_COMMUNITY): Payer: Self-pay | Admitting: *Deleted

## 2015-04-19 ENCOUNTER — Emergency Department (HOSPITAL_COMMUNITY): Payer: Self-pay

## 2015-04-19 VITALS — BP 102/68 | HR 105 | Temp 98.1°F | Resp 16 | Ht 62.0 in | Wt 112.0 lb

## 2015-04-19 DIAGNOSIS — E876 Hypokalemia: Secondary | ICD-10-CM | POA: Insufficient documentation

## 2015-04-19 DIAGNOSIS — R1900 Intra-abdominal and pelvic swelling, mass and lump, unspecified site: Secondary | ICD-10-CM | POA: Insufficient documentation

## 2015-04-19 DIAGNOSIS — R809 Proteinuria, unspecified: Secondary | ICD-10-CM | POA: Insufficient documentation

## 2015-04-19 DIAGNOSIS — Z87891 Personal history of nicotine dependence: Secondary | ICD-10-CM | POA: Insufficient documentation

## 2015-04-19 DIAGNOSIS — R112 Nausea with vomiting, unspecified: Secondary | ICD-10-CM | POA: Insufficient documentation

## 2015-04-19 DIAGNOSIS — I4581 Long QT syndrome: Secondary | ICD-10-CM | POA: Insufficient documentation

## 2015-04-19 DIAGNOSIS — R198 Other specified symptoms and signs involving the digestive system and abdomen: Secondary | ICD-10-CM

## 2015-04-19 DIAGNOSIS — G8929 Other chronic pain: Secondary | ICD-10-CM | POA: Insufficient documentation

## 2015-04-19 DIAGNOSIS — R1013 Epigastric pain: Secondary | ICD-10-CM | POA: Insufficient documentation

## 2015-04-19 DIAGNOSIS — R61 Generalized hyperhidrosis: Secondary | ICD-10-CM | POA: Insufficient documentation

## 2015-04-19 DIAGNOSIS — R9431 Abnormal electrocardiogram [ECG] [EKG]: Secondary | ICD-10-CM

## 2015-04-19 DIAGNOSIS — R109 Unspecified abdominal pain: Secondary | ICD-10-CM | POA: Insufficient documentation

## 2015-04-19 DIAGNOSIS — E860 Dehydration: Secondary | ICD-10-CM | POA: Insufficient documentation

## 2015-04-19 HISTORY — DX: Vomiting, unspecified: R11.10

## 2015-04-19 LAB — POCT URINALYSIS DIPSTICK
GLUCOSE UA: NEGATIVE
Leukocytes, UA: NEGATIVE
NITRITE UA: NEGATIVE
Protein, UA: >=300
Spec Grav, UA: >=1.03
Urobilinogen, UA: 1
pH, UA: 6

## 2015-04-19 LAB — URINALYSIS, ROUTINE W REFLEX MICROSCOPIC
GLUCOSE, UA: NEGATIVE mg/dL
Ketones, ur: 40 mg/dL — AB
LEUKOCYTES UA: NEGATIVE
NITRITE: NEGATIVE
PROTEIN: 30 mg/dL — AB
SPECIFIC GRAVITY, URINE: 1.026 (ref 1.005–1.030)
Urobilinogen, UA: 0.2 mg/dL (ref 0.0–1.0)
pH: 5.5 (ref 5.0–8.0)

## 2015-04-19 LAB — COMPREHENSIVE METABOLIC PANEL
ALBUMIN: 4.7 g/dL (ref 3.5–5.0)
ALK PHOS: 61 U/L (ref 38–126)
ALT: 17 U/L (ref 14–54)
ANION GAP: 13 (ref 5–15)
AST: 19 U/L (ref 15–41)
BILIRUBIN TOTAL: 0.8 mg/dL (ref 0.3–1.2)
BUN: 28 mg/dL — ABNORMAL HIGH (ref 6–20)
CHLORIDE: 102 mmol/L (ref 101–111)
CO2: 20 mmol/L — ABNORMAL LOW (ref 22–32)
Calcium: 9.4 mg/dL (ref 8.9–10.3)
Creatinine, Ser: 1.18 mg/dL — ABNORMAL HIGH (ref 0.44–1.00)
GFR calc Af Amer: >60 mL/min (ref >60)
GFR calc non Af Amer: 54 mL/min — ABNORMAL LOW (ref >60)
GLUCOSE: 124 mg/dL — AB (ref 70–99)
POTASSIUM: 3.1 mmol/L — AB (ref 3.5–5.1)
Sodium: 135 mmol/L (ref 135–145)
TOTAL PROTEIN: 8.3 g/dL — AB (ref 6.5–8.1)

## 2015-04-19 LAB — CBC WITH DIFFERENTIAL/PLATELET
BASOS ABS: 0.1 10*3/uL (ref 0.0–0.1)
Basophils Relative: 1 % (ref 0–1)
EOS ABS: 0 10*3/uL (ref 0.0–0.7)
Eosinophils Relative: 0 % (ref 0–5)
HCT: 44.1 % (ref 36.0–46.0)
Hemoglobin: 15.7 g/dL — ABNORMAL HIGH (ref 12.0–15.0)
Lymphocytes Relative: 41 % (ref 12–46)
Lymphs Abs: 2.2 10*3/uL (ref 0.7–4.0)
MCH: 23.3 pg — ABNORMAL LOW (ref 26.0–34.0)
MCHC: 35.6 g/dL (ref 30.0–36.0)
MCV: 65.5 fL — ABNORMAL LOW (ref 78.0–100.0)
MONOS PCT: 7 % (ref 3–12)
Monocytes Absolute: 0.4 10*3/uL (ref 0.1–1.0)
NEUTROS ABS: 2.6 10*3/uL (ref 1.7–7.7)
Neutrophils Relative %: 51 % (ref 43–77)
PLATELETS: 233 10*3/uL (ref 150–400)
RBC: 6.73 MIL/uL — ABNORMAL HIGH (ref 3.87–5.11)
RDW: 14.1 % (ref 11.5–15.5)
WBC: 5.3 10*3/uL (ref 4.0–10.5)

## 2015-04-19 LAB — LIPASE, BLOOD: Lipase: 20 U/L — ABNORMAL LOW (ref 22–51)

## 2015-04-19 LAB — URINE MICROSCOPIC-ADD ON

## 2015-04-19 LAB — TROPONIN I: Troponin I: 0.03 ng/mL (ref <0.031)

## 2015-04-19 MED ORDER — SODIUM CHLORIDE 0.9 % IV SOLN
Freq: Once | INTRAVENOUS | Status: AC
  Administered 2015-04-19: 999 mL via INTRAVENOUS

## 2015-04-19 MED ORDER — ONDANSETRON 4 MG PO TBDP
ORAL_TABLET | ORAL | Status: AC
  Filled 2015-04-19: qty 2

## 2015-04-19 MED ORDER — ONDANSETRON 8 MG PO TBDP: 8.0000 mg | ORAL_TABLET | Freq: Three times a day (TID) | ORAL | Status: DC | PRN

## 2015-04-19 MED ORDER — ONDANSETRON HCL 4 MG/2ML IJ SOLN
4.0000 mg | Freq: Once | INTRAMUSCULAR | Status: AC
  Administered 2015-04-19: 4 mg via INTRAVENOUS
  Filled 2015-04-19: qty 2

## 2015-04-19 MED ORDER — HYDROCODONE-ACETAMINOPHEN 5-325 MG PO TABS: 1.0000 | ORAL_TABLET | ORAL | Status: DC | PRN

## 2015-04-19 MED ORDER — FENTANYL CITRATE (PF) 100 MCG/2ML IJ SOLN
INTRAMUSCULAR | Status: AC
  Filled 2015-04-19: qty 2

## 2015-04-19 MED ORDER — ONDANSETRON 4 MG PO TBDP
4.0000 mg | ORAL_TABLET | Freq: Once | ORAL | Status: AC
  Administered 2015-04-20: 4 mg via ORAL
  Filled 2015-04-19: qty 1

## 2015-04-19 MED ORDER — ONDANSETRON 4 MG PO TBDP
8.0000 mg | ORAL_TABLET | Freq: Once | ORAL | Status: AC
  Administered 2015-04-19: 8 mg via ORAL

## 2015-04-19 MED ORDER — FENTANYL CITRATE (PF) 100 MCG/2ML IJ SOLN
50.0000 ug | Freq: Once | INTRAMUSCULAR | Status: AC
  Administered 2015-04-19: 50 ug via NASAL

## 2015-04-19 MED ORDER — GI COCKTAIL ~~LOC~~: 30.0000 mL | Freq: Once | ORAL | Status: DC

## 2015-04-19 MED ORDER — FENTANYL CITRATE (PF) 100 MCG/2ML IJ SOLN: 50.0000 ug | Freq: Once | INTRAMUSCULAR | Status: DC

## 2015-04-19 MED ORDER — MORPHINE SULFATE 2 MG/ML IJ SOLN: 2.0000 mg | Freq: Once | INTRAMUSCULAR | Status: DC

## 2015-04-19 MED ORDER — HYDROCODONE-ACETAMINOPHEN 5-325 MG PO TABS
1.0000 | ORAL_TABLET | Freq: Once | ORAL | Status: AC
Start: 2015-04-19 — End: 2015-04-19
  Administered 2015-04-19: 1 via ORAL
  Filled 2015-04-19: qty 1

## 2015-04-19 NOTE — ED Notes (Signed)
Frequent belching noted

## 2015-04-19 NOTE — Progress Notes (Signed)
Pt is here having stomach pain w/ nausea and vomiting.  Pt is unable to keep food down since last Friday and she has lost some weight.

## 2015-04-19 NOTE — Progress Notes (Signed)
Patient ID: Laura Guzman, female   DOB: 29-Jan-1966, 49 y.o.   MRN: [id number]  CC:  HPI: Laura Guzman is a 49 y.o. female here today for a follow up visit.  She was seen last week for DUB and nausea. She reports that since that visit she has been unable to keep fluids down or eat. She reports several episodes of vomiting non bilious fluids. She states that she has been very weak and feels like her heart is beating very fast. She complains of epigastric pain without burning but with increased gas. She feels better after burping and passing gas. She has not tried anything for the pain.  Patient has No headache, No chest pain, No abdominal pain - No Nausea, No new weakness tingling or numbness, No Cough - SOB.  Allergies  Allergen Reactions  . Compazine [Prochlorperazine Edisylate] Swelling    States "jaw locked down"   History reviewed. No pertinent past medical history. Current Outpatient Prescriptions on File Prior to Visit  Medication Sig Dispense Refill  . ferrous sulfate 325 (65 FE) MG tablet Take 1 tablet (325 mg total) by mouth daily with breakfast. (Patient not taking: Reported on 04/13/2015) 90 tablet 3  . metroNIDAZOLE (FLAGYL) 500 MG tablet Take 4 tablets (2,000 mg total) by mouth once. (Patient not taking: Reported on 04/13/2015) 4 tablet 0  . metroNIDAZOLE (METROGEL VAGINAL) 0.75 % vaginal gel Place 1 Applicatorful vaginally 2 (two) times daily. For 5 days (Patient not taking: Reported on 04/13/2015) 70 g 0  . norgestimate-ethinyl estradiol (ORTHO-CYCLEN,SPRINTEC,PREVIFEM) 0.25-35 MG-MCG tablet Take 1 tablet by mouth daily. (Patient not taking: Reported on 04/19/2015) 1 Package 11  . Vitamin D, Ergocalciferol, (DRISDOL) 50000 UNITS CAPS capsule Take 1 capsule (50,000 Units total) by mouth every 7 (seven) days. (Patient not taking: Reported on 04/19/2015) 12 capsule 0   No current facility-administered medications on file prior to visit.   Family History  Problem Relation Age of Onset   . Hypertension Mother   . Hyperlipidemia Mother    History   Social History  . Marital Status: Married    Spouse Name: N/A  . Number of Children: N/A  . Years of Education: N/A   Occupational History  . Not on file.   Social History Main Topics  . Smoking status: Former Smoker -- 0.20 packs/day for 1 years    Types: Cigarettes    Start date: 07/23/2012    Quit date: 07/23/2013  . Smokeless tobacco: Never Used  . Alcohol Use: No  . Drug Use: No  . Sexual Activity: Yes    Birth Control/ Protection: None   Other Topics Concern  . Not on file   Social History Narrative    Review of Systems: Constitutional: Negative for fever, chills, diaphoresis, activity change, appetite change and fatigue. HENT: Negative for ear pain, nosebleeds, congestion, facial swelling, rhinorrhea, neck pain, neck stiffness and ear discharge.  Eyes: Negative for pain, discharge, redness, itching and visual disturbance. Respiratory: Negative for cough, choking, chest tightness, shortness of breath, wheezing and stridor.  Cardiovascular: Negative for chest pain, palpitations and leg swelling. Gastrointestinal: Negative for abdominal distention. Genitourinary: Negative for dysuria, urgency, frequency, hematuria, flank pain, decreased urine volume, difficulty urinating and dyspareunia.  Musculoskeletal: Negative for back pain, joint swelling, arthralgias and gait problem. Neurological: Negative for dizziness, tremors, seizures, syncope, facial asymmetry, speech difficulty, weakness, light-headedness, numbness and headaches.  Hematological: Negative for adenopathy. Does not bruise/bleed easily. Psychiatric/Behavioral: Negative for hallucinations, behavioral problems, confusion, dysphoric mood, decreased concentration  and agitation.    Objective:   Filed Vitals:   04/19/15 1459  BP: 102/68  Pulse: 105  Temp: 98.1 F (36.7 C)  Resp: 16    Physical Exam  Constitutional: She is oriented to person,  place, and time.  Cardiovascular:  Irregular rhythm  Pulmonary/Chest: Effort normal and breath sounds normal.  Abdominal: There is tenderness.  Strong pulsatile beating in upper abdomen/epigastric region No bruits heard  Musculoskeletal:  Muscle tremors of upper abdomen  Neurological: She is alert and oriented to person, place, and time.  Skin: Skin is warm. She is diaphoretic.     Lab Results  Component Value Date   WBC 5.0 08/03/2014   HGB 12.1 08/03/2014   HCT 37.4 08/03/2014   MCV 69.8* 08/03/2014   PLT 273 08/03/2014   Lab Results  Component Value Date   CREATININE 0.73 08/03/2014   BUN 13 08/03/2014   NA 140 08/03/2014   K 4.4 08/03/2014   CL 105 08/03/2014   CO2 28 08/03/2014    No results found for: HGBA1C Lipid Panel     Component Value Date/Time   CHOL 194 08/03/2014 0932   TRIG 63 08/03/2014 0932   HDL 79 08/03/2014 0932   CHOLHDL 2.5 08/03/2014 0932   VLDL 13 08/03/2014 0932   LDLCALC 102* 08/03/2014 0932       Assessment and plan:   Jermiah was seen today for follow-up.  Diagnoses and all orders for this visit:  Pulsatile abdomen Did not improve after 1L NS. Originally thought it was due to patient being so thin. Need to R/o AAA. Reports that she has been having pain in the location for over one week.  Discussed this patient with Supervisory physicans Dr. Doreene Burke who believes we should send patient to ER  Epigastric pain Orders: -     Urinalysis Dipstick -     H. pylori breath test -     gi cocktail (Maalox,Lidocaine,Donnatal); Take 30 mLs by mouth once. Patient will likely need  A PPI added.  Dehydration Orders: -     0.9 %  sodium chloride infusion; Inject into the vein once.---1 L NS -     Insert peripheral IV -     EKG 12-Lead  Non-intractable vomiting with nausea, vomiting of unspecified type Unable to give patient nausea meds due to prolonged QT.  Prolonged QT interval  Patient does not have any previous EKG's to compare. Will  send for further evaluation. Patient's only med is Depo-provera injections due to DUB.   Proteinuria  BP is usually controlled, likely due to dehydration   Will follow up within one week of hospital discharge.   Chari Manning, NP-C [facility] [phone] 04/19/2015, 3:26 PM

## 2015-04-19 NOTE — ED Provider Notes (Signed)
CSN: [id number]     Arrival date & time 04/19/15  1626 History   First MD Initiated Contact with Patient 04/19/15 2143     Chief Complaint  Patient presents with  . Emesis  . Abdominal Pain     (Consider location/radiation/quality/duration/timing/severity/associated sxs/prior Treatment) HPI 49 year old female with chronic abdominal pain, nausea, and vomiting for the past 30 years. She states that this is recurrent and is like her previous episodes. She and her primary care physician feel that this is connected to her menstrual cycle. She recently was started on Depakote. Her last menstrual period was March 20 and was normal. She has had nausea with vomiting since Friday. She has been keeping down liquids liquids but not solid foods. She is described as crampy epigastric pain. She has not had any previous surgeries. Her last pregnancy was 18 years ago. She is not having any abnormal vaginal discharge. She has not had fever, chills, or urinary tract infection symptoms. Past Medical History  Diagnosis Date  . Hyperemesis    History reviewed. No pertinent past surgical history. Family History  Problem Relation Age of Onset  . Hypertension Mother   . Hyperlipidemia Mother    History  Substance Use Topics  . Smoking status: Former Smoker -- 0.20 packs/day for 1 years    Start date: 07/23/2012    Quit date: 07/23/2013  . Smokeless tobacco: Never Used  . Alcohol Use: No   OB History    Gravida Para Term Preterm AB TAB SAB Ectopic Multiple Living   3 2 2  0 1  1 0 0 2     Review of Systems  All other systems reviewed and are negative.     Allergies  Compazine  Home Medications   Prior to Admission medications   Medication Sig Start Date End Date Taking? Authorizing Provider  ferrous sulfate 325 (65 FE) MG tablet Take 1 tablet (325 mg total) by mouth daily with breakfast. Patient not taking: Reported on 04/13/2015 08/06/14   Lance Bosch, NP  metroNIDAZOLE (FLAGYL) 500 MG  tablet Take 4 tablets (2,000 mg total) by mouth once. Patient not taking: Reported on 04/13/2015 12/08/14   Lance Bosch, NP  metroNIDAZOLE (METROGEL VAGINAL) 0.75 % vaginal gel Place 1 Applicatorful vaginally 2 (two) times daily. For 5 days Patient not taking: Reported on 04/13/2015 12/03/14   Lance Bosch, NP  Vitamin D, Ergocalciferol, (DRISDOL) 50000 UNITS CAPS capsule Take 1 capsule (50,000 Units total) by mouth every 7 (seven) days. Patient not taking: Reported on 04/19/2015 08/06/14   Lance Bosch, NP   BP 160/91 mmHg  Pulse 58  Temp(Src) 98.2 F (36.8 C) (Oral)  Resp 16  Ht 5\' 2"  (1.575 m)  Wt 114 lb (51.71 kg)  BMI 20.85 kg/m2  SpO2 99%  LMP 04/06/2015 Physical Exam  Constitutional: She is oriented to person, place, and time. She appears well-developed and well-nourished.  HENT:  Head: Normocephalic and atraumatic.  Right Ear: External ear normal.  Left Ear: External ear normal.  Nose: Nose normal.  Mouth/Throat: Oropharynx is clear and moist.  Eyes: Conjunctivae and EOM are normal. Pupils are equal, round, and reactive to light.  Neck: Normal range of motion. Neck supple.  Cardiovascular: Normal rate, regular rhythm, normal heart sounds and intact distal pulses.   Pulmonary/Chest: Effort normal and breath sounds normal.  Abdominal: Soft. Bowel sounds are normal. There is no tenderness.  Decreased bowel sounds  Musculoskeletal: Normal range of motion.  Neurological: She is  alert and oriented to person, place, and time. She has normal reflexes.  Skin: Skin is warm and dry.  Psychiatric: She has a normal mood and affect. Her behavior is normal. Judgment and thought content normal.  Nursing note and vitals reviewed.   ED Course  Procedures (including critical care time) Labs Review Labs Reviewed  CBC WITH DIFFERENTIAL/PLATELET - Abnormal; Notable for the following:    RBC 6.73 (*)    Hemoglobin 15.7 (*)    MCV 65.5 (*)    MCH 23.3 (*)    All other components  within normal limits  COMPREHENSIVE METABOLIC PANEL - Abnormal; Notable for the following:    Potassium 3.1 (*)    CO2 20 (*)    Glucose, Bld 124 (*)    BUN 28 (*)    Creatinine, Ser 1.18 (*)    Total Protein 8.3 (*)    GFR calc non Af Amer 54 (*)    All other components within normal limits  LIPASE, BLOOD - Abnormal; Notable for the following:    Lipase 20 (*)    All other components within normal limits  URINALYSIS, ROUTINE W REFLEX MICROSCOPIC - Abnormal; Notable for the following:    Color, Urine AMBER (*)    Hgb urine dipstick MODERATE (*)    Bilirubin Urine SMALL (*)    Ketones, ur 40 (*)    Protein, ur 30 (*)    All other components within normal limits  URINE MICROSCOPIC-ADD ON - Abnormal; Notable for the following:    Squamous Epithelial / LPF FEW (*)    Bacteria, UA MANY (*)    Casts GRANULAR CAST (*)    All other components within normal limits  TROPONIN I    Imaging Review Dg Abd Acute W/chest  04/19/2015   CLINICAL DATA:  Nausea and emesis for 3 days  EXAM: DG ABDOMEN ACUTE W/ 1V CHEST  COMPARISON:  None.  FINDINGS: There is no evidence of dilated bowel loops or free intraperitoneal air. No radiopaque calculi or other significant radiographic abnormality is seen. Heart size and mediastinal contours are within normal limits. Both lungs are hyperinflated but clear.  IMPRESSION: Negative abdominal radiographs.  No acute cardiopulmonary disease.   Electronically Signed   By: Andreas Newport M.D.   On: 04/19/2015 22:46     EKG Interpretation None      MDM   Final diagnoses:  Nausea and vomiting    patient given IV fluids, Zofran, and 2 Vicodin. She feels improved. Her creatinine is increased from prior and patient is advised to have this rechecked with her primary care physician. Plain films were obtained due to the decreased bowel sounds and no evidence of obstructions noted. I discussed return cautions me for follow with patient she voices understanding. Mild  hypokalemia noted and patient is orally repleted here and given a prescription.     Pattricia Boss, MD 04/20/15 [phone]

## 2015-04-19 NOTE — ED Notes (Signed)
Pt sent from [facility] for continued abdominal pain despite treatment.  Pt was given GI cocktail and 1 L NS without relief.   Per pt, every month during her menstrual cycle she has hyperemesis.

## 2015-04-20 LAB — H. PYLORI BREATH TEST: H. PYLORI BREATH TEST: NOT DETECTED

## 2015-04-20 MED ORDER — POTASSIUM CHLORIDE CRYS ER 20 MEQ PO TBCR
40.0000 meq | EXTENDED_RELEASE_TABLET | Freq: Once | ORAL | Status: AC
  Administered 2015-04-20: 40 meq via ORAL
  Filled 2015-04-20: qty 2

## 2015-04-20 MED ORDER — POTASSIUM CHLORIDE CRYS ER 20 MEQ PO TBCR: 20.0000 meq | EXTENDED_RELEASE_TABLET | Freq: Once | ORAL | Status: DC

## 2015-04-20 MED ORDER — SODIUM CHLORIDE 0.9 % IV BOLUS (SEPSIS)
1000.0000 mL | Freq: Once | INTRAVENOUS | Status: AC
  Administered 2015-04-20: 1000 mL via INTRAVENOUS

## 2015-04-20 NOTE — Discharge Instructions (Signed)
Cyclic Vomiting Syndrome °Cyclic vomiting syndrome is a benign condition in which patients experience bouts or cycles of severe nausea and vomiting that last for hours or even days. The bouts of nausea and vomiting alternate with longer periods of no symptoms and generally good health. Cyclic vomiting syndrome occurs mostly in children, but can affect adults. °CAUSES  °[facility] has no known cause. Each episode is typically similar to the previous ones. The episodes tend to:  °· Start at about the same time of day. °· Last the same length of time. °· Present the same symptoms at the same level of intensity. °Cyclic vomiting syndrome can begin at any age in children and adults. Cyclic vomiting syndrome usually starts between the ages of 3 and 7 years. In adults, episodes tend to occur less often than they do in children, but they last longer. Furthermore, the events or situations that trigger episodes in adults cannot always be pinpointed as easily as they can in children. °There are 4 phases of cyclic vomiting syndrome: °1. Prodrome. The prodrome phase signals that an episode of nausea and vomiting is about to begin. This phase can last from just a few minutes to several hours. This phase is often marked by belly (abdominal) pain. Sometimes taking medicine early in the prodrome phase can stop an episode in progress. However, sometimes there is no warning. A person may simply wake up in the middle of the night or early morning and begin vomiting. °2. Episode. The episode phase consists of: °· Severe vomiting. °· Nausea. °· Gagging (retching). °3. Recovery. The recovery phase begins when the nausea and vomiting stop. Healthy color, appetite, and energy return. °4. Symptom-free interval. The symptom-free interval phase is the period between episodes when no symptoms are present. °TRIGGERS °Episodes can be triggered by an infection or event. Examples of triggers include: °· Infections. °· Colds, allergies, sinus problems, and  the flu. °· Eating certain foods such as chocolate or cheese. °· Foods with monosodium glutamate (MSG) or preservatives. °· Fast foods. °· Pre-packaged foods. °· Foods with low nutritional value (junk foods). °· Overeating. °· Eating just before going to bed. °· Hot weather. °· Dehydration. °· Not enough sleep or poor sleep quality. °· Physical exhaustion. °· Menstruation. °· Motion sickness. °· Emotional stress (school or home difficulties). °· Excitement or stress. °SYMPTOMS  °The main symptoms of cyclic vomiting syndrome are: °· Severe vomiting. °· Nausea. °· Gagging (retching). °Episodes usually begin at night or the first thing in the morning. Episodes may include vomiting or retching up to 5 or 6 times an hour during the worst of the episode. Episodes usually last anywhere from 1 to 4 days. Episodes can last for up to 10 days. Other symptoms include: °· Paleness. °· Exhaustion. °· Listlessness. °· Abdominal pain. °· Loose stools or diarrhea. °Sometimes the nausea and vomiting are so severe that a person appears to be almost unconscious. Sensitivity to light, headache, fever, dizziness, may also accompany an episode. In addition, the vomiting may cause drooling and excessive thirst. Drinking water usually leads to more vomiting, though the water can dilute the acid in the vomit, making the episode a little less painful. Continuous vomiting can lead to dehydration, which means that the body has lost excessive water and salts. °DIAGNOSIS  °Cyclic vomiting syndrome is hard to diagnose because there are no clear tests to identify it. A caregiver must diagnose cyclic vomiting syndrome by looking at symptoms and medical history. A caregiver must exclude more common diseases   or disorders that can also cause nausea and vomiting. Also, diagnosis takes time because caregivers need to identify a pattern or cycle to the vomiting. °TREATMENT  °Cyclic vomiting syndrome cannot be cured. Treatment varies, but people with  cyclic vomiting syndrome should get plenty of rest and sleep and take medications that prevent, stop, or lessen the vomiting episodes and other symptoms. °People whose episodes are frequent and long-lasting may be treated during the symptom-free intervals in an effort to prevent or ease future episodes. The symptom-free phase is a good time to eliminate anything known to trigger an episode. For example, if episodes are brought on by stress or excitement, this period is the time to find ways to reduce stress and stay calm. If sinus problems or allergies cause episodes, those conditions should be treated. The triggers listed above should be avoided or prevented. °Because of the similarities between migraine and cyclic vomiting syndrome, caregivers treat some people with severe cyclic vomiting syndrome with drugs that are also used for migraine headaches. The drugs are designed to: °· Prevent episodes. °· Reduce their frequency. °· Lessen their severity. °HOME CARE INSTRUCTIONS °Once a vomiting episode begins, treatment is supportive. It helps to stay in bed and sleep in a dark, quiet room. Severe nausea and vomiting may require hospitalization and intravenous (IV) fluids to prevent dehydration. Relaxing medications (sedatives) may help if the nausea continues. Sometimes, during the prodrome phase, it is possible to stop an episode from happening altogether. Only take over-the-counter or prescription medicines for pain, discomfort or fever as directed by your caregiver. Do not give aspirin to children. °During the recovery phase, drinking water and replacing lost electrolytes (salts in the blood) are very important. Electrolytes are salts that the body needs to function well and stay healthy. Symptoms during the recovery phase can vary. Some people find that their appetites return to normal immediately, while others need to begin by drinking clear liquids and then move slowly to solid food. °RELATED COMPLICATIONS °The  severe vomiting that defines cyclic vomiting syndrome is a risk factor for several complications: °· Dehydration--Vomiting causes the body to lose water quickly. °· Electrolyte imbalance--Vomiting also causes the body to lose the important salts it needs to keep working properly. °· Peptic esophagitis--The tube that connects the mouth to the stomach (esophagus) becomes injured from the stomach acid that comes up with the vomit. °· Hematemesis--The esophagus becomes irritated and bleeds, so blood mixes with the vomit. °· Mallory-Weiss tear--The lower end of the esophagus may tear open or the stomach may bruise from vomiting or retching. °· Tooth decay--The acid in the vomit can hurt the teeth by corroding the tooth enamel. °SEEK MEDICAL CARE IF: °You have questions or problems. °Document Released: 02/12/2002 Document Revised: 02/26/2012 Document Reviewed: 03/13/2011 °ExitCare® Patient Information ©2015 [employer]. This information is not intended to replace advice given to you by your health care provider. Make sure you discuss any questions you have with your health care provider. ° °

## 2015-04-20 NOTE — ED Notes (Signed)
Pt vomiting large amounts of emesis

## 2015-04-21 ENCOUNTER — Telehealth: Payer: Self-pay | Admitting: *Deleted

## 2015-04-21 NOTE — Telephone Encounter (Signed)
Patient called in to say that Ms. Laura Guzman wanted her to call her and let her know that she had cyclic vomiting syndrome.  Routed to PCP

## 2015-04-22 ENCOUNTER — Telehealth: Payer: Self-pay | Admitting: *Deleted

## 2015-04-22 NOTE — Telephone Encounter (Signed)
Pt is aware of her lab results.  

## 2015-04-22 NOTE — Telephone Encounter (Signed)
Left message for pt to return my call.

## 2015-04-22 NOTE — Telephone Encounter (Signed)
-----   Message from Lance Bosch, NP sent at 04/22/2015  7:55 AM EDT ----- H. Pylori breath test is negative

## 2015-05-10 ENCOUNTER — Ambulatory Visit: Payer: Self-pay

## 2015-06-04 ENCOUNTER — Ambulatory Visit: Payer: Self-pay | Attending: Internal Medicine

## 2015-06-24 ENCOUNTER — Telehealth: Payer: Self-pay | Admitting: Internal Medicine

## 2015-06-24 NOTE — Telephone Encounter (Signed)
Patient has come in today to request clarification on when she should receive her next DEPO injection; patients's PCP will be out of the office, please advise

## 2015-06-25 NOTE — Telephone Encounter (Signed)
Returning patient call. Patient verified name and date of birth. Patient notified that she can make an appointment between 7/12-7/26 to receive her depo injection. Patient voiced understanding. Patient was transferred to the front desk to make that appointment with the RN.

## 2015-07-01 ENCOUNTER — Telehealth: Payer: Self-pay | Admitting: Internal Medicine

## 2015-07-12 ENCOUNTER — Ambulatory Visit: Payer: Self-pay | Attending: Internal Medicine | Admitting: Internal Medicine

## 2015-07-12 ENCOUNTER — Encounter: Payer: Self-pay | Admitting: Internal Medicine

## 2015-07-12 VITALS — BP 100/65 | HR 82 | Temp 98.4°F | Resp 18 | Ht 62.0 in | Wt 131.0 lb

## 2015-07-12 DIAGNOSIS — N938 Other specified abnormal uterine and vaginal bleeding: Secondary | ICD-10-CM | POA: Insufficient documentation

## 2015-07-12 MED ORDER — MEDROXYPROGESTERONE ACETATE 150 MG/ML IM SUSP
150.0000 mg | Freq: Once | INTRAMUSCULAR | Status: AC
  Administered 2015-07-12: 150 mg via INTRAMUSCULAR

## 2015-07-12 NOTE — Progress Notes (Signed)
Patient ID: Laura Guzman, female   DOB: [birth year], 49 y.o.   MRN: [id number]  CC: Depo injection   HPI: Noami Guzman is a 49 y.o. female here today for a.  Patient has past medical history of DUB. Patient is here for her second Depo-provera injection. She states that she is tolerating her first injection well and has not had cramps, nausea, or vomiting.  LMP was in April, but she did experience some spotting around 07/01/15. Patient has no complaints of pain or illness at this time. She is happy with the injection and would like to continue. No headaches, hair loss, weight gain reported.   Patient has No headache, No chest pain, No abdominal pain - No Nausea, No new weakness tingling or numbness, No Cough - SOB.  Allergies  Allergen Reactions  . Compazine [Prochlorperazine Edisylate] Swelling    States "jaw locked down"   Past Medical History  Diagnosis Date  . Hyperemesis    Current Outpatient Prescriptions on File Prior to Visit  Medication Sig Dispense Refill  . ferrous sulfate 325 (65 FE) MG tablet Take 1 tablet (325 mg total) by mouth daily with breakfast. (Patient not taking: Reported on 04/13/2015) 90 tablet 3  . HYDROcodone-acetaminophen (NORCO/VICODIN) 5-325 MG per tablet Take 1 tablet by mouth every 4 (four) hours as needed. (Patient not taking: Reported on 07/12/2015) 6 tablet 0  . metroNIDAZOLE (FLAGYL) 500 MG tablet Take 4 tablets (2,000 mg total) by mouth once. (Patient not taking: Reported on 04/13/2015) 4 tablet 0  . metroNIDAZOLE (METROGEL VAGINAL) 0.75 % vaginal gel Place 1 Applicatorful vaginally 2 (two) times daily. For 5 days (Patient not taking: Reported on 04/13/2015) 70 g 0  . ondansetron (ZOFRAN ODT) 8 MG disintegrating tablet Take 1 tablet (8 mg total) by mouth every 8 (eight) hours as needed for nausea or vomiting. (Patient not taking: Reported on 07/12/2015) 20 tablet 0  . potassium chloride SA (K-DUR,KLOR-CON) 20 MEQ tablet Take 1 tablet (20 mEq total) by mouth  once. (Patient not taking: Reported on 07/12/2015) 10 tablet 0  . Vitamin D, Ergocalciferol, (DRISDOL) 50000 UNITS CAPS capsule Take 1 capsule (50,000 Units total) by mouth every 7 (seven) days. (Patient not taking: Reported on 04/19/2015) 12 capsule 0   Current Facility-Administered Medications on File Prior to Visit  Medication Dose Route Frequency Provider Last Rate Last Dose  . gi cocktail (Maalox,Lidocaine,Donnatal)  30 mL Oral Once Lance Bosch, NP       Family History  Problem Relation Age of Onset  . Hypertension Mother   . Hyperlipidemia Mother    History   Social History  . Marital Status: Married    Spouse Name: N/A  . Number of Children: N/A  . Years of Education: N/A   Occupational History  . Not on file.   Social History Main Topics  . Smoking status: Former Smoker -- 0.20 packs/day for 1 years    Start date: 07/23/2012    Quit date: 07/23/2013  . Smokeless tobacco: Never Used  . Alcohol Use: No  . Drug Use: No  . Sexual Activity: Yes    Birth Control/ Protection: None   Other Topics Concern  . Not on file   Social History Narrative    Review of Systems: See HPI    Objective:   Filed Vitals:   07/12/15 0926  BP: 100/65  Pulse: 82  Temp: 98.4 F (36.9 C)  Resp: 18    Physical Exam  Constitutional: She is oriented  to person, place, and time.  Cardiovascular: Normal rate, regular rhythm and normal heart sounds.   Pulmonary/Chest: Effort normal and breath sounds normal.  Abdominal: There is no tenderness.  Neurological: She is alert and oriented to person, place, and time.     Lab Results  Component Value Date   WBC 5.3 04/19/2015   HGB 15.7* 04/19/2015   HCT 44.1 04/19/2015   MCV 65.5* 04/19/2015   PLT 233 04/19/2015   Lab Results  Component Value Date   CREATININE 1.18* 04/19/2015   BUN 28* 04/19/2015   NA 135 04/19/2015   K 3.1* 04/19/2015   CL 102 04/19/2015   CO2 20* 04/19/2015    No results found for: HGBA1C Lipid Panel      Component Value Date/Time   CHOL 194 08/03/2014 0932   TRIG 63 08/03/2014 0932   HDL 79 08/03/2014 0932   CHOLHDL 2.5 08/03/2014 0932   VLDL 13 08/03/2014 0932   LDLCALC 102* 08/03/2014 0932       Assessment and plan:   [name] was seen today for depo injection.  Diagnoses and all orders for this visit:  DUB (dysfunctional uterine bleeding) Orders: -     medroxyPROGESTERone (DEPO-PROVERA) injection 150 mg; Inject 1 mL (150 mg total) into the muscle once.  Return for Sept 10-24 RN--Depo injection .                Lance Bosch, [facility] [phone] 07/12/2015, 9:31 AM

## 2015-07-12 NOTE — Progress Notes (Signed)
Patient here for second Depo injection.  Patient reports tolerating first injection well, and did not get sick from it.  Patient states LMP was in April, but she did experience some spotting around 07/01/15.  Patient has no complaints of pain or illness at this time.

## 2015-09-22 ENCOUNTER — Ambulatory Visit: Payer: Self-pay | Attending: Internal Medicine | Admitting: Internal Medicine

## 2015-09-22 ENCOUNTER — Encounter: Payer: Self-pay | Admitting: Internal Medicine

## 2015-09-22 VITALS — BP 144/87 | HR 75 | Temp 98.0°F | Resp 16 | Ht 62.0 in | Wt 135.8 lb

## 2015-09-22 DIAGNOSIS — Z0000 Encounter for general adult medical examination without abnormal findings: Secondary | ICD-10-CM

## 2015-09-22 DIAGNOSIS — Z23 Encounter for immunization: Secondary | ICD-10-CM | POA: Insufficient documentation

## 2015-09-22 DIAGNOSIS — Z87891 Personal history of nicotine dependence: Secondary | ICD-10-CM | POA: Insufficient documentation

## 2015-09-22 DIAGNOSIS — N938 Other specified abnormal uterine and vaginal bleeding: Secondary | ICD-10-CM | POA: Insufficient documentation

## 2015-09-22 NOTE — Progress Notes (Signed)
Patient ID: Laura Guzman, female   DOB: [birth year], 49 y.o.   MRN: [id number]  CC: Depo injection   HPI: Laura Guzman is a 49 y.o. female here today for a follow up visit.  Patient has past medical history of DUB and hyperemesis. Patient reports that she is here to see if she needs to continue her depo injections. She notes significant improvement in uterine bleeding since beginning her injections. She notes that she has had only 3 periods since beginning her injections, two of which has caused her hyperemesis. She states that her last period was 3 weeks ago and is still currently on. She reports that the flow is very light and she only had nausea for 1 week. She has used pills in the past for DUB with no relief. Patient reports that she is interested in getting a hysterectomy if possible.  Patient has No headache, No chest pain, No abdominal pain, No new weakness tingling or numbness, No Cough - SOB.  Allergies  Allergen Reactions  . Compazine [Prochlorperazine Edisylate] Swelling    States "jaw locked down"   Past Medical History  Diagnosis Date  . Hyperemesis    No current outpatient prescriptions on file prior to visit.   No current facility-administered medications on file prior to visit.   Family History  Problem Relation Age of Onset  . Hypertension Mother   . Hyperlipidemia Mother    Social History   Social History  . Marital Status: Married    Spouse Name: N/A  . Number of Children: N/A  . Years of Education: N/A   Occupational History  . Not on file.   Social History Main Topics  . Smoking status: Former Smoker -- 0.20 packs/day for 1 years    Start date: 07/23/2012    Quit date: 07/23/2013  . Smokeless tobacco: Never Used  . Alcohol Use: No  . Drug Use: No  . Sexual Activity: Yes    Birth Control/ Protection: None   Other Topics Concern  . Not on file   Social History Narrative    Review of Systems: Other than what is stated in HPI, all other  systems are negative.   Objective:   Filed Vitals:   09/22/15 0927  BP: 144/87  Pulse: 75  Temp: 98 F (36.7 C)  Resp: 16    Physical Exam  Cardiovascular: Normal rate, regular rhythm and normal heart sounds.   Pulmonary/Chest: Effort normal and breath sounds normal.  Abdominal: Soft. Bowel sounds are normal. She exhibits no distension. There is no tenderness.  Skin: Skin is warm and dry.     Lab Results  Component Value Date   WBC 5.3 04/19/2015   HGB 15.7* 04/19/2015   HCT 44.1 04/19/2015   MCV 65.5* 04/19/2015   PLT 233 04/19/2015   Lab Results  Component Value Date   CREATININE 1.18* 04/19/2015   BUN 28* 04/19/2015   NA 135 04/19/2015   K 3.1* 04/19/2015   CL 102 04/19/2015   CO2 20* 04/19/2015    No results found for: HGBA1C Lipid Panel     Component Value Date/Time   CHOL 194 08/03/2014 0932   TRIG 63 08/03/2014 0932   HDL 79 08/03/2014 0932   CHOLHDL 2.5 08/03/2014 0932   VLDL 13 08/03/2014 0932   LDLCALC 102* 08/03/2014 0932       Assessment and plan:   Liller was seen today for follow-up.  Diagnoses and all orders for this visit:  DUB (dysfunctional uterine  bleeding) -     Ambulatory referral to Gynecology I have explained that if the Depo injections are working then I suggest she stay on it. I have explained that Depo is not guaranteed to stop her periods altogether but to help with bleeding. I suggest that she goes to GYN if she wants further options. She will return next week for Depo injection  Health care maintenance -     Flu Vaccine QUAD 36+ mos PF IM (Fluarix & Fluzone Quad PF)   Return for RN-Depo Injection next week.    Lance Bosch, [facility] [phone] 09/22/2015, 9:39 AM

## 2015-09-22 NOTE — Progress Notes (Signed)
Patient here to discuss some issues with her depo shots Patient states she was doing good for a while but just recently  Had a period that again made her sick to her stomach

## 2015-09-23 ENCOUNTER — Encounter: Payer: Self-pay | Admitting: Obstetrics & Gynecology

## 2015-09-29 ENCOUNTER — Ambulatory Visit: Payer: Self-pay | Attending: Internal Medicine

## 2015-09-29 VITALS — BP 121/76 | HR 64 | Temp 98.5°F | Resp 18 | Ht 64.0 in | Wt 135.0 lb

## 2015-09-29 DIAGNOSIS — Z308 Encounter for other contraceptive management: Secondary | ICD-10-CM | POA: Insufficient documentation

## 2015-09-29 DIAGNOSIS — Z30019 Encounter for initial prescription of contraceptives, unspecified: Secondary | ICD-10-CM

## 2015-09-29 MED ORDER — MEDROXYPROGESTERONE ACETATE 150 MG/ML IM SUSP
150.0000 mg | Freq: Once | INTRAMUSCULAR | Status: AC
  Administered 2015-09-29: 150 mg via INTRAMUSCULAR

## 2015-09-29 NOTE — Progress Notes (Signed)
Patient here for depo shot. Last depo given 07/12/15. Patient is within recommended time frame of 09/27/15 - 10/11/15. Patient reports feeling well today. Administered depo today, next depo due 12/15/15-12/29/15.

## 2015-09-29 NOTE — Patient Instructions (Signed)
Please return between 12/15/15-12/29/15 for next depo injection.

## 2015-10-08 ENCOUNTER — Ambulatory Visit: Payer: Self-pay | Attending: Internal Medicine

## 2015-10-14 ENCOUNTER — Ambulatory Visit (INDEPENDENT_AMBULATORY_CARE_PROVIDER_SITE_OTHER): Payer: Self-pay | Admitting: Obstetrics and Gynecology

## 2015-10-14 ENCOUNTER — Encounter: Payer: Self-pay | Admitting: Obstetrics and Gynecology

## 2015-10-14 VITALS — BP 112/66 | HR 72 | Temp 98.4°F | Ht 62.0 in | Wt 137.0 lb

## 2015-10-14 DIAGNOSIS — N938 Other specified abnormal uterine and vaginal bleeding: Secondary | ICD-10-CM

## 2015-10-14 DIAGNOSIS — Z3200 Encounter for pregnancy test, result unknown: Secondary | ICD-10-CM

## 2015-10-14 NOTE — Progress Notes (Signed)
Patient ID: Laura Guzman, female   DOB: 09-27-1966, 49 y.o.   MRN: [id number] 49 yo G3P2012 presenting today for further management of her abnormal uterine bleeding. Patient was seen a year ago and started on OCP and she later changed it to depo-provera. She reports a lighter monthly cycle followed by several days of vaginal spotting. She reports some persistent nausea which makes it difficult for her to function daily. She attributes her nausea with her episodes of heavy vaginal bleeding but it does not occur monthly  Past Medical History  Diagnosis Date  . Hyperemesis    No past surgical history on file. Family History  Problem Relation Age of Onset  . Hypertension Mother   . Hyperlipidemia Mother    Social History  Substance Use Topics  . Smoking status: Former Smoker -- 0.20 packs/day for 1 years    Start date: 07/23/2012    Quit date: 07/23/2013  . Smokeless tobacco: Never Used  . Alcohol Use: Yes     Comment: social, ocassional    ROS See pertinent in HPI  Blood pressure 112/66, pulse 72, temperature 98.4 F (36.9 C), temperature source Oral, height 5\' 2"  (1.575 m), weight 137 lb (62.143 kg), last menstrual period 08/26/2015. GENERAL: Well-developed, well-nourished female in no acute distress.  ABDOMEN: Soft, nontender, nondistended. No organomegaly. PELVIC: Normal external female genitalia. Vagina is pink and rugated.  Normal discharge. Normal appearing cervix. Uterus is normal in size. No adnexal mass or tenderness. EXTREMITIES: No cyanosis, clubbing, or edema, 2+ distal pulses.  A/P 49 yo with DUB - Patient with negative endometrial biopsy in October 2016 - Pelvic ultrasound ordered - Discussed surgical management with endometrial ablation given small size of uterus on exam. Patient was agreeable. Patient will be scheduled for the procedure

## 2015-10-14 NOTE — Patient Instructions (Signed)
Endometrial Ablation °Endometrial ablation removes the lining of the uterus (endometrium). It is usually a same-day, outpatient treatment. Ablation helps avoid major surgery, such as surgery to remove the cervix and uterus (hysterectomy). After endometrial ablation, you will have little or no menstrual bleeding and may not be able to have children. However, if you are premenopausal, you will need to use a reliable method of birth control following the procedure because of the small chance that pregnancy can occur. °There are different reasons to have this procedure. These reasons include: °· Heavy periods. °· Bleeding that is causing anemia. °· Irregular bleeding. °· Bleeding fibroids on the lining inside the uterus if they are smaller than 3 centimeters. °This procedure may not be possible for you if:  °· You want to have children in the future.   °· You have severe cramps with your menstrual period.   °· You have precancerous or cancerous cells in your uterus.   °· You were recently pregnant.   °· You have gone through menopause.   °· You have had major surgery on your uterus, resulting in thinning of the uterine wall. Surgeries may include: °¨ The removal of one or more uterine fibroids (myomectomy). °¨ A cesarean section with a classic (vertical) incision on your uterus. Ask your health care provider what type of cesarean you had. Sometimes the scar on your skin is different than the scar on your uterus. °Even if you have had surgery on your uterus, certain types of ablation may still be safe for you. Talk with your health care provider. °LET YOUR HEALTH CARE PROVIDER KNOW ABOUT: °· Any allergies you have. °· All medicines you are taking, including vitamins, herbs, eye drops, creams, and over-the-counter medicines. °· Previous problems you or members of your family have had with the use of anesthetics. °· Any blood disorders you have. °· Previous surgeries you have had. °· Medical conditions you have. °RISKS AND  COMPLICATIONS  °Generally, this is a safe procedure. However, as with any procedure, complications can occur. Possible complications include: °· Perforation of the uterus. °· Bleeding. °· Infection of the uterus, bladder, or vagina. °· Injury to surrounding organs. °· An air bubble to the lung (air embolus). °· Pregnancy following the procedure. °· Failure of the procedure to help the problem, requiring hysterectomy. °· Decreased ability to diagnose cancer in the lining of the uterus. °BEFORE THE PROCEDURE °· The lining of the uterus must be tested to make sure there is no pre-cancerous or cancer cells present. °· An ultrasound may be performed to look at the size of the uterus and to check for abnormalities. °· Medicines may be given to thin the lining of the uterus. °PROCEDURE  °During the procedure, your health care provider will use a tool called a resectoscope to help see inside your uterus. There are different ways to remove the lining of your uterus.  °· Radiofrequency - This method uses a radiofrequency-alternating electric current to remove the lining of the uterus. °· Cryotherapy - This method uses extreme cold to freeze the lining of the uterus. °· Heated-Free Liquid - This method uses heated salt (saline) solution to remove the lining of the uterus. °· Microwave - This method uses high-energy microwaves to heat up the lining of the uterus to remove it. °· Thermal balloon - This method involves inserting a catheter with a balloon tip into the uterus. The balloon tip is filled with heated fluid to remove the lining of the uterus. °AFTER THE PROCEDURE  °After your procedure, do   not have sexual intercourse or insert anything into your vagina until permitted by your health care provider. After the procedure, you may experience: °· Cramps. °· Vaginal discharge. °· Frequent urination. °  °This information is not intended to replace advice given to you by your health care provider. Make sure you discuss any  questions you have with your health care provider. °  °Document Released: 10/13/2004 Document Revised: 08/25/2015 Document Reviewed: 05/07/2013 °Elsevier Interactive Patient Education ©2016 [employer] ° °

## 2015-10-20 ENCOUNTER — Encounter (HOSPITAL_COMMUNITY): Payer: Self-pay | Admitting: *Deleted

## 2015-10-20 ENCOUNTER — Ambulatory Visit (HOSPITAL_COMMUNITY)
Admission: RE | Admit: 2015-10-20 | Discharge: 2015-10-20 | Disposition: A | Discharge status: Home / Self Care | Payer: Self-pay | Source: Ambulatory Visit | Attending: Obstetrics and Gynecology | Admitting: Obstetrics and Gynecology

## 2015-10-20 DIAGNOSIS — N946 Dysmenorrhea, unspecified: Secondary | ICD-10-CM | POA: Insufficient documentation

## 2015-10-20 DIAGNOSIS — D259 Leiomyoma of uterus, unspecified: Secondary | ICD-10-CM | POA: Insufficient documentation

## 2015-10-20 DIAGNOSIS — N938 Other specified abnormal uterine and vaginal bleeding: Secondary | ICD-10-CM | POA: Insufficient documentation

## 2015-11-17 ENCOUNTER — Encounter (HOSPITAL_COMMUNITY): Payer: Self-pay | Admitting: *Deleted

## 2015-11-26 ENCOUNTER — Telehealth: Payer: Self-pay | Admitting: General Practice

## 2015-11-26 NOTE — Telephone Encounter (Signed)
Patient called and left message stating she is a patient of Dr Elly Modena and is supposed to be having surgery next week on the 13th. Patient states she is feels like she is about to start her cycle and when she does she gets really sick for 5 days. Patient doesn't know if we need to reschedule her surgery or if it will be fine to go ahead. Called patient back stating her surgery did not need to be rescheduled for that, they will go ahead with it. Patient verbalized understanding & had no other questions

## 2015-11-29 NOTE — H&P (Signed)
Laura Guzman is an 49 y.o. female [id number] here for scheduled endometrial ablation. Patient with history of DUB with failed medical management with Depo-provera and OCP. Patient is without current complaints. She is nauseated this morning. She states that she always feels that way if she doesn't eat  Pertinent Gynecological History: Menses: flow is moderate and regular every month without intermenstrual spotting Bleeding: dysfunctional uterine bleeding Contraception: OCP (estrogen/progesterone) DES exposure: denies Blood transfusions: none Previous GYN Procedures: DNC  Last mammogram: normal Date: 07/2014 Last pap: normal Date: 07/2014 OB History: G3, P2012   Menstrual History: Patient's last menstrual period was 08/26/2015 (exact date).    Past Medical History  Diagnosis Date  . Hyperemesis     This is called cyclic vomitting syndrome    Past Surgical History  Procedure Laterality Date  . No past surgeries      Family History  Problem Relation Age of Onset  . Hypertension Mother   . Hyperlipidemia Mother     Social History:  reports that she quit smoking about 2 years ago. She started smoking about 3 years ago. She has never used smokeless tobacco. She reports that she drinks alcohol. She reports that she does not use illicit drugs.  Allergies:  Allergies  Allergen Reactions  . Compazine [Prochlorperazine Edisylate] Swelling    States "jaw locked down"    Prescriptions prior to admission  Medication Sig Dispense Refill Last Dose  . medroxyPROGESTERone (DEPO-PROVERA) 150 MG/ML injection Inject 150 mg into the muscle every 3 (three) months.   Taking    ROS See pertinent in HPI  Blood pressure 147/1, pulse 73, temperature 98.3 F (36.8 C), temperature source Oral, resp. rate 18, height 5\' 3"  (1.6 m), weight 125 lb (56.7 kg), last menstrual period 08/26/2015, SpO2 100 %. Physical Exam GENERAL: Well-developed, well-nourished female in no acute distress.  HEENT:  Normocephalic, atraumatic. Sclerae anicteric.  NECK: Supple. Normal thyroid.  LUNGS: Clear to auscultation bilaterally.  HEART: Regular rate and rhythm. ABDOMEN: Soft, nontender, nondistended. No organomegaly. PELVIC: Deferred to OR EXTREMITIES: No cyanosis, clubbing, or edema, 2+ distal pulses.  Results for orders placed or performed during the hospital encounter of 11/30/15 (from the past 24 hour(s))  Pregnancy, urine     Status: None   Collection Time: 11/30/15  8:15 AM  Result Value Ref Range   Preg Test, Ur NEGATIVE NEGATIVE  CBC     Status: Abnormal (Preliminary result)   Collection Time: 11/30/15  8:20 AM  Result Value Ref Range   WBC 6.4 4.0 - 10.5 K/uL   RBC 5.31 (H) 3.87 - 5.11 MIL/uL   Hemoglobin 12.0 12.0 - 15.0 g/dL   HCT 36.2 36.0 - 46.0 %   MCV 68.2 (L) 78.0 - 100.0 fL   MCH 22.6 (L) 26.0 - 34.0 pg   MCHC 33.1 30.0 - 36.0 g/dL   RDW 14.7 11.5 - 15.5 %   Platelets PENDING 150 - 400 K/uL    No results found. 10/20/2015 Ultrasound FINDINGS: Uterus  Measurements: 8.4 x 5.0 x 6.1 cm. Posterior fundal fibroid measures 1.8 x 1.4 x 1.7 cm.  Endometrium  Thickness: 3.4 mm. No focal abnormality visualized.  Right ovary  Measurements: 2.5 x 1.5 x 2.8 cm. Normal appearance/no adnexal mass.  Left ovary  Measurements: 1.4 x 0.6 x 1.6 cm. Normal appearance/no adnexal mass.  Other findings  No free fluid.  IMPRESSION: 1. No acute findings and no explanation for dysfunctional uterine bleeding. In the setting of post-menopausal bleeding, this  is consistent with a benign etiology such as endometrial atrophy. If bleeding remains unresponsive to hormonal or medical therapy, sonohysterogram should be considered for focal lesion work-up. (Ref: Radiological Reasoning: Algorithmic Workup of Abnormal Vaginal Bleeding with Endovaginal Sonography and Sonohysterography. AJR 2008; [id number]) 2. Small fundal fibroid.    Assessment/Plan: 49 yo with DUB  here for scheduled endometrial ablation - Risks, benefits and alteratives were reviewed with the patient including but not limited to risks of bleeding, infection, uterine perforation and damage to adjacent organs. Patient verbalized understanding and all questions were answered.  Carleton Van[place]kenburgh 11/30/2015, 9:01 AM

## 2015-11-30 ENCOUNTER — Ambulatory Visit (HOSPITAL_COMMUNITY): Payer: Self-pay | Admitting: Anesthesiology

## 2015-11-30 ENCOUNTER — Encounter (HOSPITAL_COMMUNITY): Payer: Self-pay | Admitting: *Deleted

## 2015-11-30 ENCOUNTER — Encounter (HOSPITAL_COMMUNITY)
Admission: RE | Disposition: A | Discharge status: Home / Self Care | Payer: Self-pay | Source: Ambulatory Visit | Attending: Obstetrics and Gynecology

## 2015-11-30 ENCOUNTER — Ambulatory Visit (HOSPITAL_COMMUNITY)
Admission: RE | Admit: 2015-11-30 | Discharge: 2015-11-30 | Disposition: A | Discharge status: Home / Self Care | Payer: Self-pay | Source: Ambulatory Visit | Attending: Obstetrics and Gynecology | Admitting: Obstetrics and Gynecology

## 2015-11-30 DIAGNOSIS — N938 Other specified abnormal uterine and vaginal bleeding: Secondary | ICD-10-CM | POA: Insufficient documentation

## 2015-11-30 DIAGNOSIS — N939 Abnormal uterine and vaginal bleeding, unspecified: Secondary | ICD-10-CM | POA: Insufficient documentation

## 2015-11-30 DIAGNOSIS — Z87891 Personal history of nicotine dependence: Secondary | ICD-10-CM | POA: Insufficient documentation

## 2015-11-30 HISTORY — PX: HYSTEROSCOPY: SHX211

## 2015-11-30 LAB — CBC
HCT: 36.2 % (ref 36.0–46.0)
Hemoglobin: 12 g/dL (ref 12.0–15.0)
MCH: 22.6 pg — AB (ref 26.0–34.0)
MCHC: 33.1 g/dL (ref 30.0–36.0)
MCV: 68.2 fL — ABNORMAL LOW (ref 78.0–100.0)
Platelets: 230 10*3/uL (ref 150–400)
RBC: 5.31 MIL/uL — AB (ref 3.87–5.11)
RDW: 14.7 % (ref 11.5–15.5)
WBC: 6.4 10*3/uL (ref 4.0–10.5)

## 2015-11-30 LAB — PREGNANCY, URINE: PREG TEST UR: NEGATIVE

## 2015-11-30 SURGERY — ABLATION, ENDOMETRIUM, HYSTEROSCOPIC
Anesthesia: General | Site: Vagina

## 2015-11-30 MED ORDER — CHLOROPROCAINE HCL 1 % IJ SOLN
INTRAMUSCULAR | Status: AC
  Filled 2015-11-30: qty 30

## 2015-11-30 MED ORDER — ONDANSETRON HCL 4 MG/2ML IJ SOLN
INTRAMUSCULAR | Status: DC | PRN
  Administered 2015-11-30: 4 mg via INTRAVENOUS

## 2015-11-30 MED ORDER — ONDANSETRON HCL 4 MG/2ML IJ SOLN: 4.0000 mg | Freq: Once | INTRAMUSCULAR | Status: DC | PRN

## 2015-11-30 MED ORDER — SODIUM CHLORIDE 0.9 % IR SOLN
Status: DC | PRN
  Administered 2015-11-30: 3000 mL

## 2015-11-30 MED ORDER — IBUPROFEN 600 MG PO TABS: 600.0000 mg | ORAL_TABLET | Freq: Four times a day (QID) | ORAL | Status: DC | PRN

## 2015-11-30 MED ORDER — ONDANSETRON HCL 4 MG/2ML IJ SOLN
INTRAMUSCULAR | Status: AC
  Filled 2015-11-30: qty 2

## 2015-11-30 MED ORDER — KETOROLAC TROMETHAMINE 30 MG/ML IJ SOLN: 30.0000 mg | Freq: Once | INTRAMUSCULAR | Status: DC | PRN

## 2015-11-30 MED ORDER — DEXAMETHASONE SODIUM PHOSPHATE 10 MG/ML IJ SOLN
INTRAMUSCULAR | Status: AC
Start: 2015-11-30 — End: 2015-11-30
  Filled 2015-11-30: qty 1

## 2015-11-30 MED ORDER — OXYCODONE-ACETAMINOPHEN 5-325 MG PO TABS: 1.0000 | ORAL_TABLET | ORAL | Status: DC | PRN

## 2015-11-30 MED ORDER — PROPOFOL 10 MG/ML IV BOLUS
INTRAVENOUS | Status: DC | PRN
  Administered 2015-11-30: 150 mg via INTRAVENOUS
  Administered 2015-11-30: 50 mg via INTRAVENOUS

## 2015-11-30 MED ORDER — METOCLOPRAMIDE HCL 5 MG/ML IJ SOLN
10.0000 mg | Freq: Once | INTRAMUSCULAR | Status: AC | PRN
  Administered 2015-11-30: 10 mg via INTRAVENOUS

## 2015-11-30 MED ORDER — MIDAZOLAM HCL 2 MG/2ML IJ SOLN
INTRAMUSCULAR | Status: DC | PRN
  Administered 2015-11-30: 2 mg via INTRAVENOUS

## 2015-11-30 MED ORDER — FENTANYL CITRATE (PF) 100 MCG/2ML IJ SOLN
25.0000 ug | INTRAMUSCULAR | Status: DC | PRN
Start: 2015-11-30 — End: 2015-11-30
  Administered 2015-11-30: 50 ug via INTRAVENOUS
  Administered 2015-11-30: 25 ug via INTRAVENOUS

## 2015-11-30 MED ORDER — SCOPOLAMINE 1 MG/3DAYS TD PT72
1.0000 | MEDICATED_PATCH | Freq: Once | TRANSDERMAL | Status: DC
  Administered 2015-11-30: 1.5 mg via TRANSDERMAL

## 2015-11-30 MED ORDER — KETOROLAC TROMETHAMINE 30 MG/ML IJ SOLN
INTRAMUSCULAR | Status: DC | PRN
  Administered 2015-11-30: 30 mg via INTRAVENOUS

## 2015-11-30 MED ORDER — PROPOFOL 10 MG/ML IV BOLUS
INTRAVENOUS | Status: AC
  Filled 2015-11-30: qty 20

## 2015-11-30 MED ORDER — KETOROLAC TROMETHAMINE 30 MG/ML IJ SOLN
INTRAMUSCULAR | Status: AC
  Filled 2015-11-30: qty 1

## 2015-11-30 MED ORDER — FENTANYL CITRATE (PF) 100 MCG/2ML IJ SOLN
INTRAMUSCULAR | Status: AC
  Filled 2015-11-30: qty 2

## 2015-11-30 MED ORDER — GLYCOPYRROLATE 0.2 MG/ML IJ SOLN
INTRAMUSCULAR | Status: DC | PRN
  Administered 2015-11-30: 0.1 mg via INTRAVENOUS

## 2015-11-30 MED ORDER — DOCUSATE SODIUM 100 MG PO CAPS: 100.0000 mg | ORAL_CAPSULE | Freq: Two times a day (BID) | ORAL | Status: DC | PRN

## 2015-11-30 MED ORDER — LIDOCAINE HCL (CARDIAC) 20 MG/ML IV SOLN
INTRAVENOUS | Status: AC
  Filled 2015-11-30: qty 5

## 2015-11-30 MED ORDER — METOCLOPRAMIDE HCL 5 MG/ML IJ SOLN
INTRAMUSCULAR | Status: AC
  Administered 2015-11-30: 10 mg via INTRAVENOUS
  Filled 2015-11-30: qty 2

## 2015-11-30 MED ORDER — OXYCODONE-ACETAMINOPHEN 5-325 MG PO TABS
ORAL_TABLET | ORAL | Status: AC
  Filled 2015-11-30: qty 1

## 2015-11-30 MED ORDER — HYDROCODONE-ACETAMINOPHEN 7.5-325 MG PO TABS: 1.0000 | ORAL_TABLET | Freq: Once | ORAL | Status: DC | PRN

## 2015-11-30 MED ORDER — CHLOROPROCAINE HCL 1 % IJ SOLN
INTRAMUSCULAR | Status: DC | PRN
  Administered 2015-11-30: 20 mL

## 2015-11-30 MED ORDER — MEPERIDINE HCL 25 MG/ML IJ SOLN: 6.2500 mg | INTRAMUSCULAR | Status: DC | PRN

## 2015-11-30 MED ORDER — OXYCODONE-ACETAMINOPHEN 5-325 MG PO TABS
1.0000 | ORAL_TABLET | Freq: Once | ORAL | Status: AC
  Administered 2015-11-30: 1 via ORAL

## 2015-11-30 MED ORDER — FENTANYL CITRATE (PF) 100 MCG/2ML IJ SOLN
INTRAMUSCULAR | Status: DC | PRN
  Administered 2015-11-30 (×2): 50 ug via INTRAVENOUS

## 2015-11-30 MED ORDER — SCOPOLAMINE 1 MG/3DAYS TD PT72
MEDICATED_PATCH | TRANSDERMAL | Status: AC
  Administered 2015-11-30: 1.5 mg via TRANSDERMAL
  Filled 2015-11-30: qty 1

## 2015-11-30 MED ORDER — LIDOCAINE HCL (CARDIAC) 20 MG/ML IV SOLN
INTRAVENOUS | Status: DC | PRN
  Administered 2015-11-30: 80 mg via INTRAVENOUS

## 2015-11-30 MED ORDER — LACTATED RINGERS IV SOLN
INTRAVENOUS | Status: DC
  Administered 2015-11-30 (×2): via INTRAVENOUS

## 2015-11-30 MED ORDER — DEXAMETHASONE SODIUM PHOSPHATE 10 MG/ML IJ SOLN
INTRAMUSCULAR | Status: DC | PRN
  Administered 2015-11-30: 8 mg via INTRAVENOUS

## 2015-11-30 MED ORDER — MIDAZOLAM HCL 2 MG/2ML IJ SOLN
INTRAMUSCULAR | Status: AC
  Filled 2015-11-30: qty 2

## 2015-11-30 MED ORDER — GLYCOPYRROLATE 0.2 MG/ML IJ SOLN
INTRAMUSCULAR | Status: AC
  Filled 2015-11-30: qty 1

## 2015-11-30 SURGICAL SUPPLY — 14 items
CATH ROBINSON RED A/P 16FR (CATHETERS) ×3 IMPLANT
CLOTH BEACON ORANGE TIMEOUT ST (SAFETY) ×3 IMPLANT
CONTAINER PREFILL 10% NBF 60ML (FORM) ×6 IMPLANT
GLOVE BIOGEL PI IND STRL 6.5 (GLOVE) ×1 IMPLANT
GLOVE BIOGEL PI IND STRL 7.0 (GLOVE) ×1 IMPLANT
GLOVE BIOGEL PI INDICATOR 6.5 (GLOVE) ×2
GLOVE BIOGEL PI INDICATOR 7.0 (GLOVE) ×2
GLOVE SURG SS PI 6.0 STRL IVOR (GLOVE) ×3 IMPLANT
GOWN STRL REUS W/TWL LRG LVL3 (GOWN DISPOSABLE) ×6 IMPLANT
PACK VAGINAL MINOR WOMEN LF (CUSTOM PROCEDURE TRAY) ×3 IMPLANT
PAD OB MATERNITY 4.3X12.25 (PERSONAL CARE ITEMS) ×3 IMPLANT
SET GENESYS HTA PROCERVA (MISCELLANEOUS) ×3 IMPLANT
TOWEL OR 17X24 6PK STRL BLUE (TOWEL DISPOSABLE) ×6 IMPLANT
WATER STERILE IRR 1000ML POUR (IV SOLUTION) ×3 IMPLANT

## 2015-11-30 NOTE — Anesthesia Postprocedure Evaluation (Addendum)
Anesthesia Post Note  Patient: Laura Guzman  Procedure(s) Performed: Procedure(s) (LRB): HYSTEROSCOPY WITH HYDROTHERMAL ABLATION (N/A)  Patient location during evaluation: PACU Anesthesia Type: General Level of consciousness: awake and alert Pain management: pain level controlled Vital Signs Assessment: post-procedure vital signs reviewed and stable Respiratory status: spontaneous breathing, nonlabored ventilation and respiratory function stable Cardiovascular status: blood pressure returned to baseline and stable Postop Assessment: no signs of nausea or vomiting Anesthetic complications: no    Last Vitals:  Filed Vitals:   11/30/15 1145 11/30/15 1200  BP: 155/83 148/89  Pulse: 69 75  Temp:    Resp: 14 14    Last Pain:  Filed Vitals:   11/30/15 1212  PainSc: 4                  Renee Erb A.

## 2015-11-30 NOTE — Transfer of Care (Addendum)
Immediate Anesthesia Transfer of Care Note  Patient: Laura Guzman  Procedure(s) Performed: Procedure(s): HYSTEROSCOPY WITH HYDROTHERMAL ABLATION (N/A)  Patient Location: PACU  Anesthesia Type:General  Level of Consciousness: awake, alert , oriented and patient cooperative  Airway & Oxygen Therapy: Patient Spontanous Breathing and Patient connected to nasal cannula oxygen  Post-op Assessment: Report given to RN and Post -op Vital signs reviewed and stable  Post vital signs: Reviewed and stable  Last Vitals:  Filed Vitals:   11/30/15 0836  BP: 147/1  Pulse: 73  Temp: 36.8 C  Resp: 18    Complications: No apparent anesthesia complications

## 2015-11-30 NOTE — Discharge Instructions (Signed)
Endometrial Ablation °Endometrial ablation removes the lining of the uterus (endometrium). It is usually a same-day, outpatient treatment. Ablation helps avoid major surgery, such as surgery to remove the cervix and uterus (hysterectomy). After endometrial ablation, you will have little or no menstrual bleeding and may not be able to have children. However, if you are premenopausal, you will need to use a reliable method of birth control following the procedure because of the small chance that pregnancy can occur. °There are different reasons to have this procedure. These reasons include: °· Heavy periods. °· Bleeding that is causing anemia. °· Irregular bleeding. °· Bleeding fibroids on the lining inside the uterus if they are smaller than 3 centimeters. °This procedure may not be possible for you if:  °· You want to have children in the future.   °· You have severe cramps with your menstrual period.   °· You have precancerous or cancerous cells in your uterus.   °· You were recently pregnant.   °· You have gone through menopause.   °· You have had major surgery on your uterus, resulting in thinning of the uterine wall. Surgeries may include: °¨ The removal of one or more uterine fibroids (myomectomy). °¨ A cesarean section with a classic (vertical) incision on your uterus. Ask your health care provider what type of cesarean you had. Sometimes the scar on your skin is different than the scar on your uterus. °Even if you have had surgery on your uterus, certain types of ablation may still be safe for you. Talk with your health care provider. °LET YOUR HEALTH CARE PROVIDER KNOW ABOUT: °· Any allergies you have. °· All medicines you are taking, including vitamins, herbs, eye drops, creams, and over-the-counter medicines. °· Previous problems you or members of your family have had with the use of anesthetics. °· Any blood disorders you have. °· Previous surgeries you have had. °· Medical conditions you have. °RISKS AND  COMPLICATIONS  °Generally, this is a safe procedure. However, as with any procedure, complications can occur. Possible complications include: °· Perforation of the uterus. °· Bleeding. °· Infection of the uterus, bladder, or vagina. °· Injury to surrounding organs. °· An air bubble to the lung (air embolus). °· Pregnancy following the procedure. °· Failure of the procedure to help the problem, requiring hysterectomy. °· Decreased ability to diagnose cancer in the lining of the uterus. °BEFORE THE PROCEDURE °· The lining of the uterus must be tested to make sure there is no pre-cancerous or cancer cells present. °· An ultrasound may be performed to look at the size of the uterus and to check for abnormalities. °· Medicines may be given to thin the lining of the uterus. °PROCEDURE  °During the procedure, your health care provider will use a tool called a resectoscope to help see inside your uterus. There are different ways to remove the lining of your uterus.  °· Radiofrequency - This method uses a radiofrequency-alternating electric current to remove the lining of the uterus. °· Cryotherapy - This method uses extreme cold to freeze the lining of the uterus. °· Heated-Free Liquid - This method uses heated salt (saline) solution to remove the lining of the uterus. °· Microwave - This method uses high-energy microwaves to heat up the lining of the uterus to remove it. °· Thermal balloon - This method involves inserting a catheter with a balloon tip into the uterus. The balloon tip is filled with heated fluid to remove the lining of the uterus. °AFTER THE PROCEDURE  °After your procedure, do   not have sexual intercourse or insert anything into your vagina until permitted by your health care provider. After the procedure, you may experience:  Cramps.  Vaginal discharge.  Frequent urination.   This information is not intended to replace advice given to you by your health care provider. Make sure you discuss any  questions you have with your health care provider.   Document Released: 10/13/2004 Document Revised: 08/25/2015 Document Reviewed: 05/07/2013 Elsevier Interactive Patient Education 2016 [facility] Instructions  Activity: Get plenty of rest for the remainder of the day. A responsible adult should stay with you for 24 hours following the procedure.  For the next 24 hours, DO NOT: -Drive a car -Paediatric nurse -Drink alcoholic beverages -Take any medication unless instructed by your physician -Make any legal decisions or sign important papers.  Meals: Start with liquid foods such as gelatin or soup. Progress to regular foods as tolerated. Avoid greasy, spicy, heavy foods. If nausea and/or vomiting occur, drink only clear liquids until the nausea and/or vomiting subsides. Call your physician if vomiting continues.  Special Instructions/Symptoms: Your throat may feel dry or sore from the anesthesia or the breathing tube placed in your throat during surgery. If this causes discomfort, gargle with warm salt water. The discomfort should disappear within 24 hours.  If you had a scopolamine patch placed behind your ear for the management of post- operative nausea and/or vomiting:  1. The medication in the patch is effective for 72 hours, after which it should be removed.  Wrap patch in a tissue and discard in the trash. Wash hands thoroughly with soap and water. 2. You may remove the patch earlier than 72 hours if you experience unpleasant side effects which may include dry mouth, dizziness or visual disturbances. 3. Avoid touching the patch. Wash your hands with soap and water after contact with the patch.

## 2015-11-30 NOTE — Op Note (Signed)
PREOPERATIVE DIAGNOSIS:  Abnormal uterine bleeding POSTOPERATIVE DIAGNOSIS: The same PROCEDURE: Hysteroscopy, Hydrothermal Endometrial Ablation SURGEON:  Dr. Mora Bellman  INDICATIONS: 49 y.o. [id number] here for scheduled surgery for abnormal uterine bleeding. Risks of surgery were discussed with the patient including but not limited to: bleeding; infection which may require antibiotics; injury to uterus leading to risk of injury to surrounding intraperitoneal organs, burn injury to vagina or other organs, need for additional procedures including laparoscopy or laparotomy, inability to complete ablation due to uterine or mechanical anomaly, and other postoperative/anesthesia complications.  Patient was informed that there is a high likelihood of success of controlling her symptoms; however about 5% of patients may require further intervention.  Written informed consent was obtained.    FINDINGS:  An 8 week size uterus.  Diffuse proliferative endometrium.  Normal ostia bilaterally.  ANESTHESIA:   General, paracervical block. INTRAVENOUS FLUIDS:  1900 ml of LR ESTIMATED BLOOD LOSS:  20 ml SPECIMENS: none COMPLICATIONS:  None immediate.  PROCEDURE DETAILS:  The patient was then taken to the operating room where general anesthesia was administered and was found to be adequate.  After an adequate timeout was performed, she was placed in the dorsal lithotomy position and examined; then prepped and draped in the sterile manner.   Her bladder was catheterized for clear, yellow urine. A speculum was then placed in the patient's vagina and a single tooth tenaculum was applied to the anterior lip of the cervix.   A paracervical block using 30 ml of 0.5% Marcaine was administered.  The cervix was sounded to 7.5 cm and dilated manually with metal dilators to accommodate the hydrothermal ablation hysteroscopic apparatus.  Once the cervix was dilated, a sharp curettage was then performed to obtain a moderate amount  of endometrial curettings.  The hysteroscope was inserted under direct visualization using normal saline as a suspension medium.  The uterine cavity was carefully examined, both ostia were recognized, and diffusely proliferative endometrium was noted.   The hydrothermal ablation was then carried out as per protocol.   Complete ablation of the endometrium was observed and the hysteroscope was removed under direct visualization.  No complications were observed.  The tenaculum was removed from the anterior lip of the cervix, and the vaginal speculum was removed after noting good hemostasis.  The patient tolerated the procedure well and was taken to the recovery area awake, extubated and in stable condition.  The patient will be discharged to home as per PACU criteria.  Routine postoperative instructions given.  She was prescribed Percocet, Ibuprofen and Colace.

## 2015-11-30 NOTE — Anesthesia Preprocedure Evaluation (Signed)
Anesthesia Evaluation  Patient identified by MRN, date of birth, ID band Patient awake    Reviewed: Allergy & Precautions, H&P , NPO status , Patient's Chart, lab work & pertinent test results  Airway Mallampati: I  TM Distance: >3 FB Neck ROM: full    Dental no notable dental hx. (+) Teeth Intact   Pulmonary former smoker,    Pulmonary exam normal        Cardiovascular negative cardio ROS Normal cardiovascular exam     Neuro/Psych negative neurological ROS  negative psych ROS   GI/Hepatic negative GI ROS, Neg liver ROS,   Endo/Other  negative endocrine ROS  Renal/GU negative Renal ROS     Musculoskeletal   Abdominal Normal abdominal exam  (+)   Peds  Hematology negative hematology ROS (+)   Anesthesia Other Findings   Reproductive/Obstetrics negative OB ROS                             Anesthesia Physical Anesthesia Plan  ASA: I  Anesthesia Plan: General   Post-op Pain Management:    Induction: Intravenous  Airway Management Planned: LMA  Additional Equipment:   Intra-op Plan:   Post-operative Plan:   Informed Consent: I have reviewed the patients History and Physical, chart, labs and discussed the procedure including the risks, benefits and alternatives for the proposed anesthesia with the patient or authorized representative who has indicated his/her understanding and acceptance.     Plan Discussed with: CRNA and Surgeon  Anesthesia Plan Comments:         Anesthesia Quick Evaluation  

## 2015-11-30 NOTE — Anesthesia Procedure Notes (Signed)
Procedure Name: LMA Insertion Date/Time: 11/30/2015 9:48 AM Performed by: Georgeanne Nim Pre-anesthesia Checklist: Patient identified, Patient being monitored, Timeout performed, Emergency Drugs available and Suction available Patient Re-evaluated:Patient Re-evaluated prior to inductionOxygen Delivery Method: Circle system utilized Preoxygenation: Pre-oxygenation with 100% oxygen Intubation Type: IV induction Ventilation: Mask ventilation without difficulty LMA: LMA with gastric port inserted LMA Size: 4.0 Number of attempts: 1 Placement Confirmation: positive ETCO2,  CO2 detector and breath sounds checked- equal and bilateral Tube secured with: Tape Dental Injury: Teeth and Oropharynx as per pre-operative assessment

## 2015-12-01 ENCOUNTER — Encounter (HOSPITAL_COMMUNITY): Payer: Self-pay | Admitting: Obstetrics and Gynecology

## 2015-12-22 ENCOUNTER — Ambulatory Visit: Payer: Self-pay | Admitting: Obstetrics and Gynecology

## 2015-12-31 ENCOUNTER — Ambulatory Visit (INDEPENDENT_AMBULATORY_CARE_PROVIDER_SITE_OTHER): Payer: Self-pay | Admitting: Obstetrics and Gynecology

## 2015-12-31 ENCOUNTER — Encounter: Payer: Self-pay | Admitting: Obstetrics and Gynecology

## 2015-12-31 VITALS — BP 112/81 | HR 98 | Temp 98.0°F | Wt 116.4 lb

## 2015-12-31 DIAGNOSIS — Z9889 Other specified postprocedural states: Secondary | ICD-10-CM

## 2015-12-31 DIAGNOSIS — N760 Acute vaginitis: Secondary | ICD-10-CM

## 2015-12-31 NOTE — Progress Notes (Signed)
Patient ID: Laura Guzman, female   DOB: [birth year], 50 y.o.   MRN: [id number] 50 yo G3P2012 here for post op check s/p endometrial abaltion on 11/30/2015 for the management of DUB. Patient reports the presence of a vaginal discharge with odor since the procedure. She denies any vaginal bleeding. She reports persistent cycles of nausea and emesis (cyclic vomiting syndrome) which were present before the ablation. She was hoping that these symptoms would resolve now that she has been amenorrheic.  Past Medical History  Diagnosis Date  . Hyperemesis     This is called cyclic vomitting syndrome   Past Surgical History  Procedure Laterality Date  . No past surgeries    . Hysteroscopy N/A 11/30/2015    Procedure: HYSTEROSCOPY WITH HYDROTHERMAL ABLATION;  Surgeon: Mora Bellman, MD;  Location: [facility];  Service: Gynecology;  Laterality: N/A;   Family History  Problem Relation Age of Onset  . Hypertension Mother   . Hyperlipidemia Mother    Social History  Substance Use Topics  . Smoking status: Former Smoker -- 0.20 packs/day for 1 years    Start date: 07/23/2012    Quit date: 07/23/2013  . Smokeless tobacco: Never Used  . Alcohol Use: Yes     Comment: social, ocassional   ROS See pertinent in HPI  Blood pressure 112/81, pulse 98, temperature 98 F (36.7 C), weight 116 lb 6.4 oz (52.799 kg).  GENERAL: Well-developed, well-nourished female in no acute distress.  ABDOMEN: Soft, nontender, nondistended. No organomegaly. PELVIC: Normal external female genitalia. Vagina is pink and rugated.  Normal discharge. Normal appearing cervix. Uterus is normal in size. No adnexal mass or tenderness. EXTREMITIES: No cyanosis, clubbing, or edema, 2+ distal pulses.  A/P 50 yo here for post of check - patient is medically cleared to resume all activities of daily living - Wet prep collected - Patient was advised to keep a menstrual calendar and to return if abnormal bleeding returns - I reminded  the patient that amenorrhea was not the goal of the procedure but rather improvement in her abnormal bleeding pattern as she transitions into menopause - Patient will be contacted with any abnormal results - Advised patient to follow up with PCP or GI for further evaluation of her CVS

## 2016-01-01 LAB — WET PREP, GENITAL
Trich, Wet Prep: NONE SEEN
Yeast Wet Prep HPF POC: NONE SEEN

## 2016-01-03 ENCOUNTER — Telehealth: Payer: Self-pay

## 2016-01-03 ENCOUNTER — Telehealth: Payer: Self-pay | Admitting: *Deleted

## 2016-01-03 MED ORDER — METRONIDAZOLE 500 MG PO TABS: 500.0000 mg | ORAL_TABLET | Freq: Two times a day (BID) | ORAL | Status: DC

## 2016-01-03 NOTE — Telephone Encounter (Signed)
Attempted to reach this patient concerning her being positive for BV and flagyl being rx but her voicemail box was full.

## 2016-01-03 NOTE — Telephone Encounter (Signed)
Called pt to inform pt of +BV and treatment prescribed. I explained the diagnosis and pt voiced understanding. She will obtain medication from her pharmacy.

## 2016-01-03 NOTE — Addendum Note (Signed)
Addended by: Mora Bellman on: 01/03/2016 02:59 PM   Modules accepted: Orders

## 2016-01-04 NOTE — Telephone Encounter (Signed)
Pt notified of lab results

## 2016-02-28 ENCOUNTER — Telehealth: Payer: Self-pay | Admitting: Internal Medicine

## 2016-02-28 ENCOUNTER — Telehealth: Payer: Self-pay

## 2016-02-28 NOTE — Telephone Encounter (Signed)
Pt. Called requesting to speak with her PCP. Pt. Stated is not an emergency but she would like to speak with her. Pt. Did not give information. Please f/u

## 2016-02-28 NOTE — Telephone Encounter (Signed)
Please find out what patient needs. Thank you. If it is regarding care she may make an appointment

## 2016-02-28 NOTE — Telephone Encounter (Signed)
Returned phone call to patient Patient would like to speak with her provider directly Please call  Patient at your earliest

## 2016-02-29 ENCOUNTER — Telehealth: Payer: Self-pay
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
## 2016-03-17 ENCOUNTER — Telehealth: Payer: Self-pay

## 2016-03-17 ENCOUNTER — Telehealth: Payer: Self-pay | Admitting: Internal Medicine

## 2016-03-17 NOTE — Telephone Encounter (Signed)
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

## 2016-03-17 NOTE — Telephone Encounter (Signed)
Patient would like to speak to nurse and discuss medication she could take for the abdominal pain, she states she was waiting on call from nurse regarding update...please follow up with patient

## 2016-03-21 ENCOUNTER — Other Ambulatory Visit: Payer: Self-pay | Admitting: Internal Medicine

## 2016-03-21 DIAGNOSIS — R112 Nausea with vomiting, unspecified: Secondary | ICD-10-CM

## 2016-03-21 MED ORDER — ONDANSETRON HCL 4 MG PO TABS: 4.0000 mg | ORAL_TABLET | Freq: Three times a day (TID) | ORAL | Status: DC | PRN

## 2016-03-21 NOTE — Telephone Encounter (Signed)
She can have Zofran which I have sent to pharmacy. She can use Tylenol for pain. Where is her pain?

## 2016-07-27 IMAGING — US US PELVIS COMPLETE
1 series · 15 of 25 positions shown · non-contrast
Comparison: None

CLINICAL DATA: Dysfunctional uterine bleeding.  Dysmenorrhea



[Series 1: us pelvis complete · 15 of 54 slices shown]
[im 1/54]
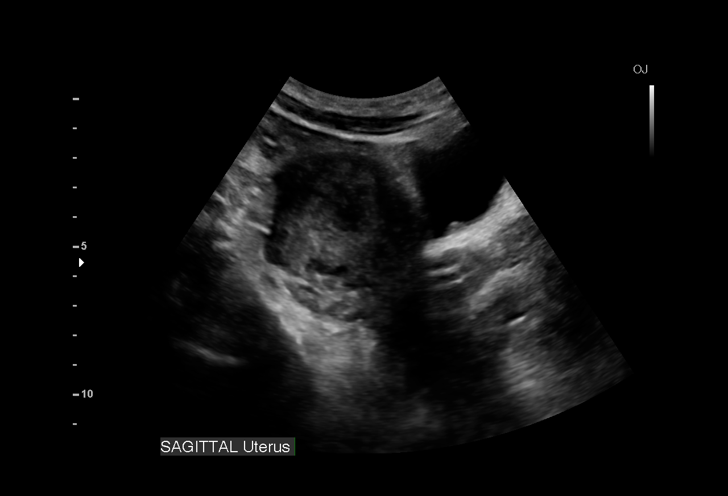
[im 5/54]
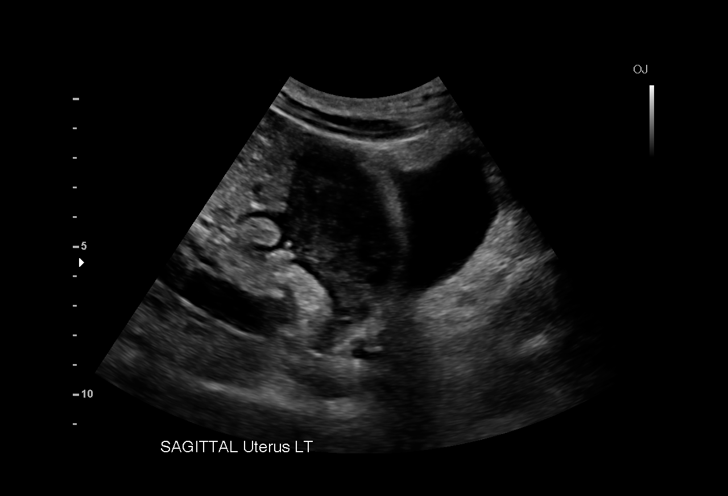
[im 9/54]
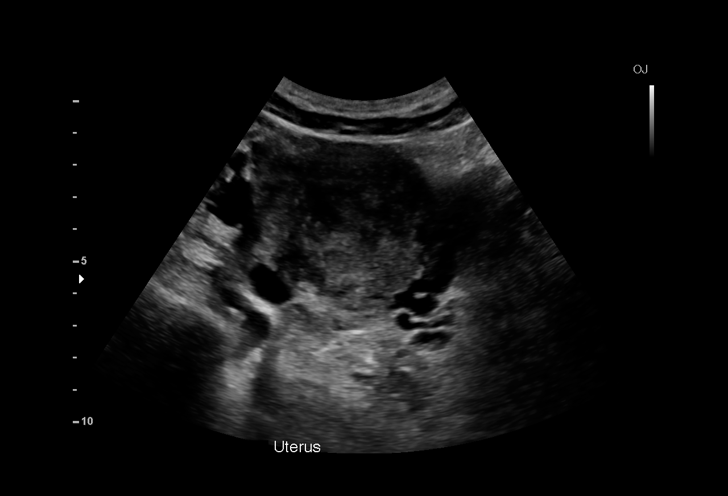
[im 12/54]
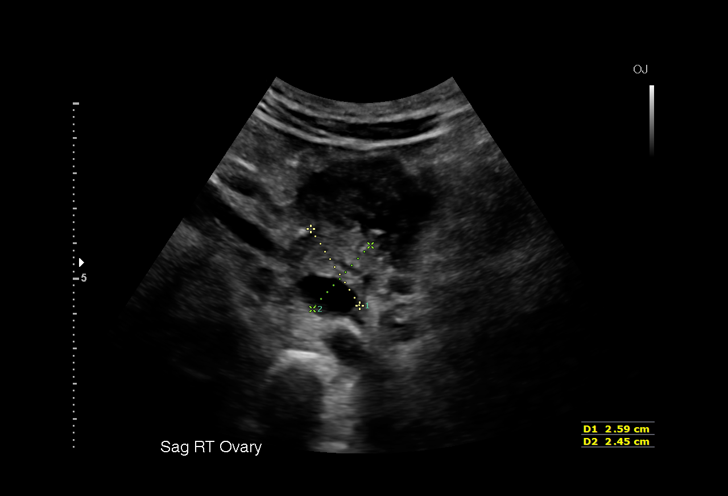
[im 16/54]
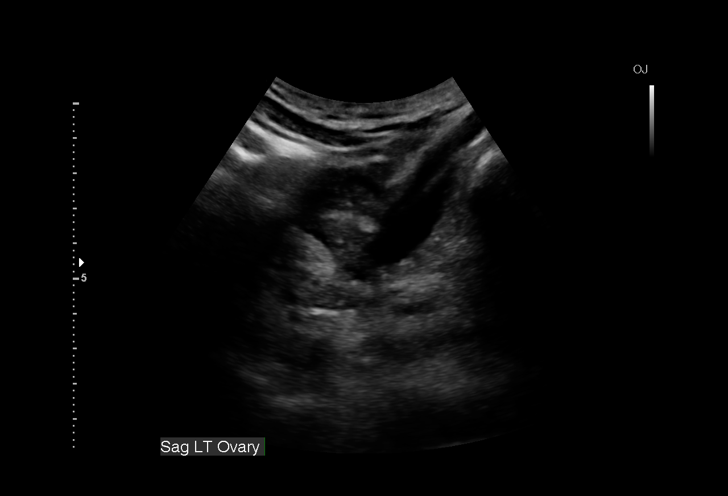
[im 20/54]
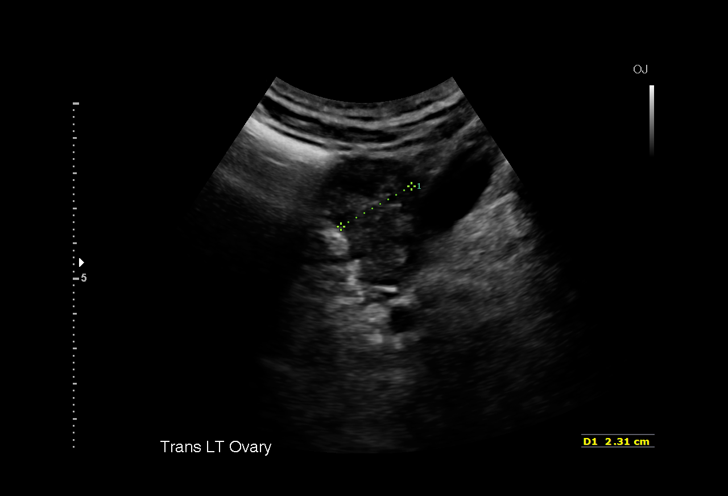
[im 23/54]
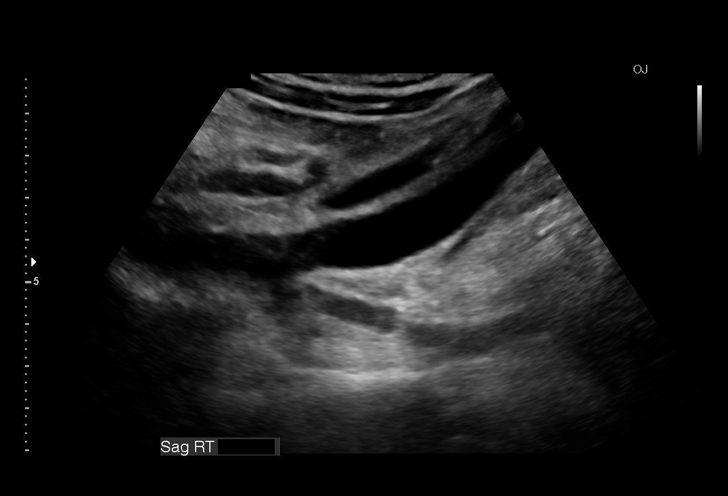
[im 27/54]
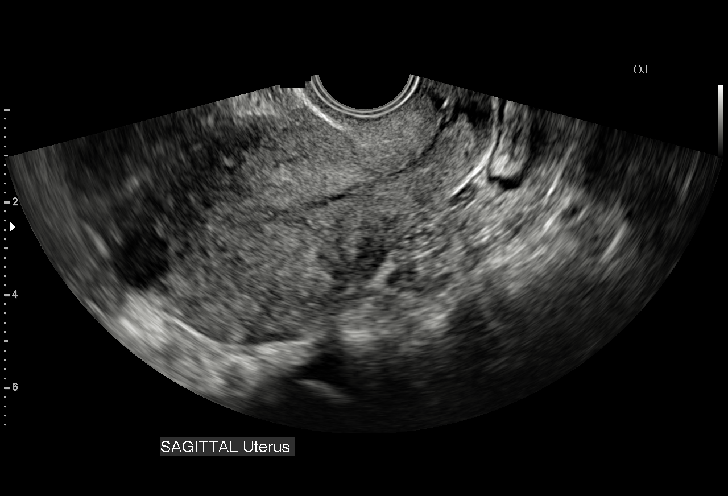
[im 31/54]
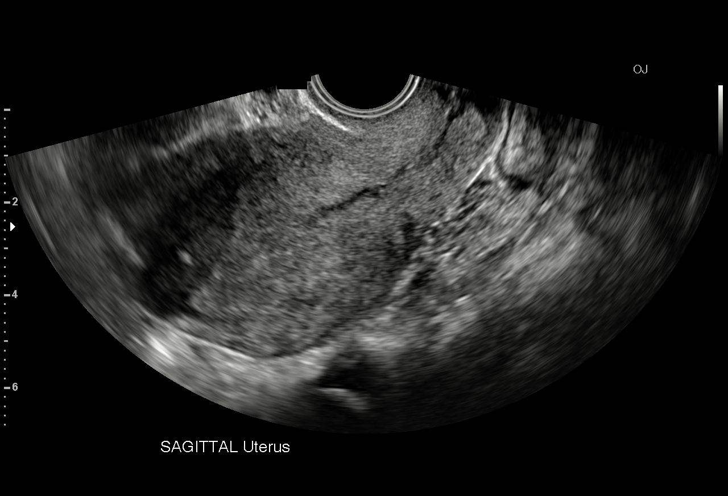
[im 34/54]
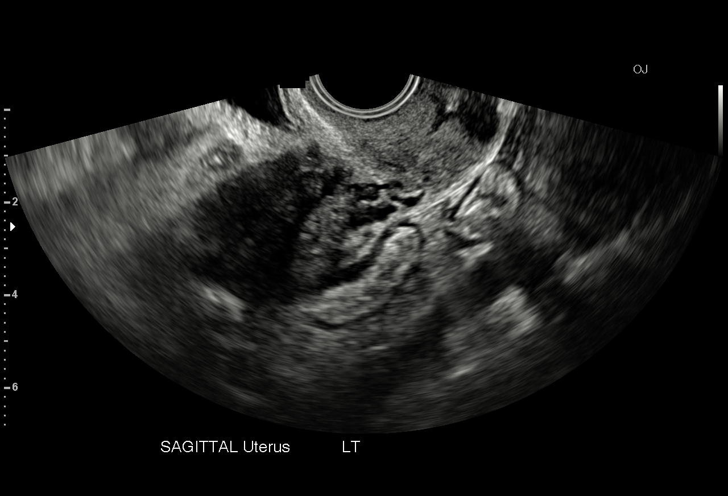
[im 38/54]
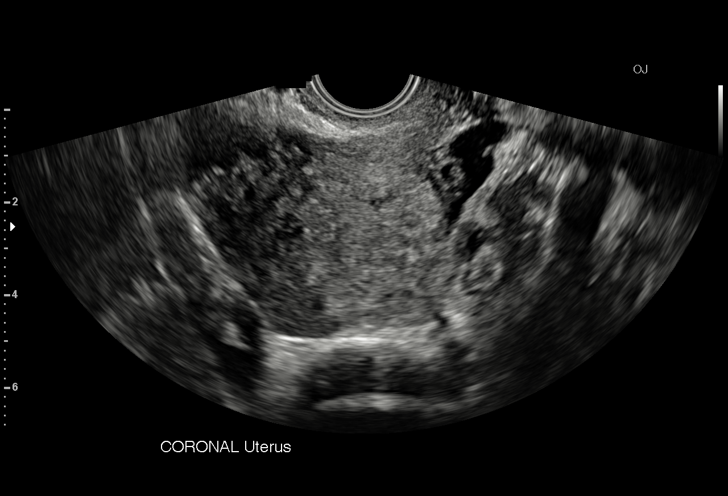
[im 42/54]
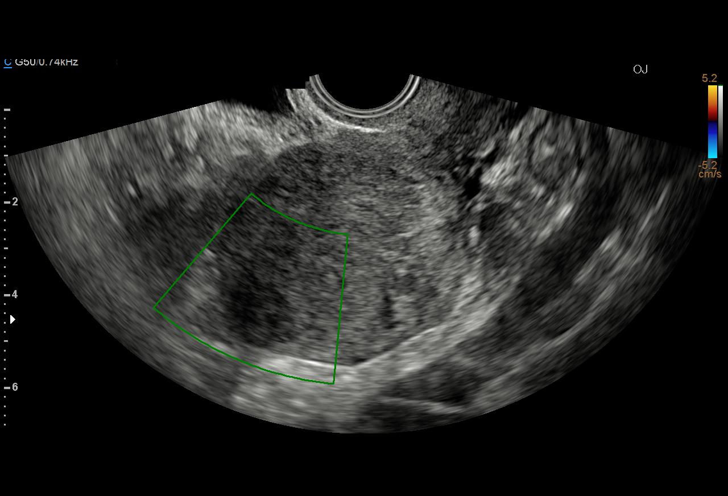
[im 45/54]
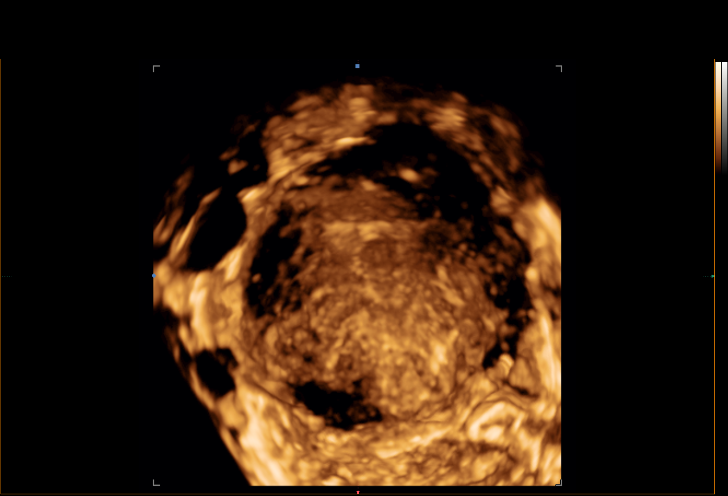
[im 49/54]
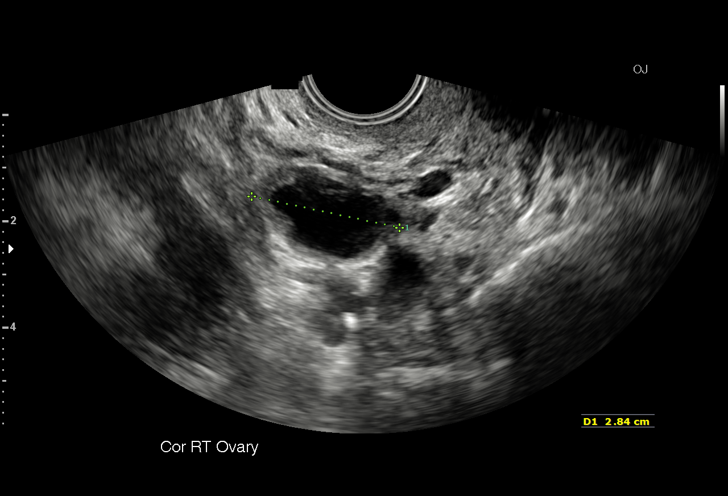
[im 54/54]
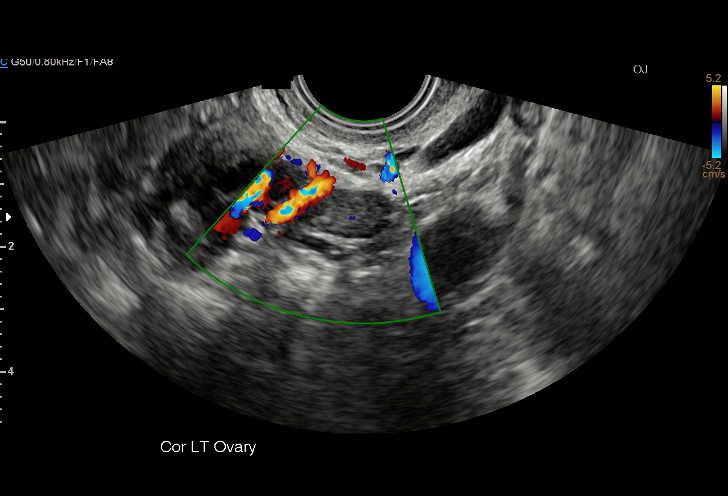

[15 of 25 positions shown; findings below may reference images not displayed]

FINDINGS: Uterus

Measurements: 8.4 x 5.0 x 6.1 cm.. Posterior fundal fibroid measures
1.8 x 1.4 x 1.7 cm.

Endometrium

Thickness: 3.4 mm..  No focal abnormality visualized.

Right ovary

Measurements: 2.5 x 1.5 x 2.8 cm. Normal appearance/no adnexal mass.

Left ovary

Measurements: 1.4 x 0.6 x 1.6 cm. Normal appearance/no adnexal mass.

Other findings

No free fluid.
IMPRESSION: 1. No acute findings and no explanation for dysfunctional uterine
bleeding. In the setting of post-menopausal bleeding, this is
consistent with a benign etiology such as endometrial atrophy. If
bleeding remains unresponsive to hormonal or medical therapy,
sonohysterogram should be considered for focal lesion work-up. (Ref:
Radiological Reasoning: Algorithmic Workup of Abnormal Vaginal
Bleeding with Endovaginal Sonography and Sonohysterography. AJR
[id number]; 191:S68-73)
2. Small fundal fibroid.

## 2021-08-21 ENCOUNTER — Emergency Department (HOSPITAL_COMMUNITY)
Admission: EM | Admit: 2021-08-21 | Discharge: 2021-08-21 | Disposition: A | Discharge status: Home / Self Care | Payer: 59 | Attending: Emergency Medicine | Admitting: Emergency Medicine

## 2021-08-21 ENCOUNTER — Other Ambulatory Visit: Payer: Self-pay

## 2021-08-21 ENCOUNTER — Encounter (HOSPITAL_COMMUNITY): Payer: Self-pay | Admitting: Emergency Medicine

## 2021-08-21 DIAGNOSIS — Z87891 Personal history of nicotine dependence: Secondary | ICD-10-CM | POA: Insufficient documentation

## 2021-08-21 DIAGNOSIS — M542 Cervicalgia: Secondary | ICD-10-CM | POA: Diagnosis present

## 2021-08-21 DIAGNOSIS — Y92410 Unspecified street and highway as the place of occurrence of the external cause: Secondary | ICD-10-CM | POA: Diagnosis not present

## 2021-08-21 DIAGNOSIS — M25512 Pain in left shoulder: Secondary | ICD-10-CM | POA: Insufficient documentation
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
## 2022-04-25 ENCOUNTER — Ambulatory Visit (INDEPENDENT_AMBULATORY_CARE_PROVIDER_SITE_OTHER): Payer: 59 | Admitting: Physician Assistant

## 2022-04-25 ENCOUNTER — Encounter: Payer: Self-pay | Admitting: Physician Assistant

## 2022-04-25 VITALS — BP 130/88 | HR 81 | Ht 64.0 in | Wt 138.8 lb

## 2022-04-25 DIAGNOSIS — K649 Unspecified hemorrhoids: Secondary | ICD-10-CM

## 2022-04-25 DIAGNOSIS — G43D0 Abdominal migraine, not intractable: Secondary | ICD-10-CM

## 2022-04-25 DIAGNOSIS — N959 Unspecified menopausal and perimenopausal disorder: Secondary | ICD-10-CM | POA: Diagnosis not present

## 2022-04-25 DIAGNOSIS — N6320 Unspecified lump in the left breast, unspecified quadrant: Secondary | ICD-10-CM | POA: Diagnosis not present

## 2022-04-25 DIAGNOSIS — Z1211 Encounter for screening for malignant neoplasm of colon: Secondary | ICD-10-CM

## 2022-04-25 DIAGNOSIS — Z6823 Body mass index (BMI) 23.0-23.9, adult: Secondary | ICD-10-CM

## 2022-04-25 NOTE — Assessment & Plan Note (Signed)
Will refer to GYN for further evaluation.  

## 2022-04-25 NOTE — Assessment & Plan Note (Signed)
Stable, will monitor 

## 2022-04-25 NOTE — Progress Notes (Signed)
? ?New Patient Office Visit ? ?Subjective:  ?Patient ID: Laura Guzman, female    DOB: 27-Feb-1966  Age: 56 y.o. MRN: [id number] ? ?CC:  ?Chief Complaint  ?Patient presents with  ? Acute Visit  ?  New pt get est. Lump in breast and hot flashes. She also has hemorrhoids that bleeds  ? ? ?HPI ?Laura Guzman presents to establish care ?Reports that she incidentally noticed a lump in her left breast while randomly scratching about 4 weeks ago; denies pain; has had 2 normal mammograms in the past ?Reports having intermittent hot flashes since age 45  ?Reports that she rarely drinks caffeine; drinks 3 - 4 bottles of water daily; intermittently eats chocolate ?Reports that she has hemorrhoids since having first child; worse since 2017; preparation H is helpful; sometimes has bleeding with bowel movements ? ? ?Outpatient Encounter Medications as of 04/25/2022  ?Medication Sig  ? amitriptyline (ELAVIL) 50 MG tablet Take 50 mg by mouth at bedtime.  ? Multiple Vitamin (MULTIVITAMIN) tablet Take 1 tablet by mouth daily.  ? [DISCONTINUED] docusate sodium (COLACE) 100 MG capsule Take 1 capsule (100 mg total) by mouth 2 (two) times daily as needed. (Patient not taking: Reported on 04/25/2022)  ? [DISCONTINUED] ibuprofen (ADVIL,MOTRIN) 600 MG tablet Take 1 tablet (600 mg total) by mouth every 6 (six) hours as needed. (Patient not taking: Reported on 12/31/2015)  ? [DISCONTINUED] metroNIDAZOLE (FLAGYL) 500 MG tablet Take 1 tablet (500 mg total) by mouth 2 (two) times daily. (Patient not taking: Reported on 04/25/2022)  ? [DISCONTINUED] ondansetron (ZOFRAN) 4 MG tablet Take 1 tablet (4 mg total) by mouth every 8 (eight) hours as needed for nausea or vomiting. (Patient not taking: Reported on 04/25/2022)  ? [DISCONTINUED] oxyCODONE-acetaminophen (PERCOCET/ROXICET) 5-325 MG tablet Take 1 tablet by mouth every 4 (four) hours as needed. (Patient not taking: Reported on 12/31/2015)  ? ?No facility-administered encounter medications on file as  of 04/25/2022.  ? ? ?Past Medical History:  ?Diagnosis Date  ? Hyperemesis   ? This is called cyclic vomitting syndrome  ? ? ?Past Surgical History:  ?Procedure Laterality Date  ? HYSTEROSCOPY N/A 11/30/2015  ? Procedure: HYSTEROSCOPY WITH HYDROTHERMAL ABLATION;  Surgeon: Mora Bellman, MD;  Location: [facility];  Service: Gynecology;  Laterality: N/A;  ? NO PAST SURGERIES    ? ? ?Family History  ?Problem Relation Age of Onset  ? Hypertension Mother   ? Hyperlipidemia Mother   ? Kidney disease Mother   ? Heart attack Father   ? Hypertension Father   ? Kidney disease Father   ? Colon cancer Neg Hx   ? CVA Neg Hx   ? Prostate cancer Neg Hx   ? Breast cancer Neg Hx   ? Diabetes type II Neg Hx   ? ? ?Social History  ? ?Socioeconomic History  ? Marital status: Married  ?  Spouse name: Not on file  ? Number of children: Not on file  ? Years of education: Not on file  ? Highest education level: Not on file  ?Occupational History  ? Not on file  ?Tobacco Use  ? Smoking status: Former  ?  Packs/day: 0.20  ?  Years: 1.00  ?  Pack years: 0.20  ?  Types: Cigarettes  ?  Start date: 07/23/2012  ?  Quit date: 07/23/2013  ?  Years since quitting: 8.7  ? Smokeless tobacco: Never  ?Substance and Sexual Activity  ? Alcohol use: Yes  ?  Comment: social, ocassional  ?  Drug use: No  ? Sexual activity: Yes  ?  Birth control/protection: None  ?Other Topics Concern  ? Not on file  ?Social History Narrative  ? Not on file  ? ?Social Determinants of Health  ? ?Financial Resource Strain: Not on file  ?Food Insecurity: Not on file  ?Transportation Needs: Not on file  ?Physical Activity: Not on file  ?Stress: Not on file  ?Social Connections: Not on file  ?Intimate Partner Violence: Not on file  ? ? ?ROS ?Review of Systems  ?Constitutional:  Negative for activity change and chills.  ?HENT:  Negative for congestion and voice change.   ?Eyes:  Negative for pain and redness.  ?Respiratory:  Negative for cough and wheezing.   ?Cardiovascular:  Negative for  chest pain.  ?Gastrointestinal:  Positive for rectal pain. Negative for constipation, diarrhea, nausea and vomiting.  ?Endocrine: Positive for heat intolerance. Negative for polyuria.  ?Genitourinary:  Negative for frequency.  ?Skin:  Negative for color change, rash and wound.  ?Allergic/Immunologic: Negative for immunocompromised state.  ?Neurological:  Negative for dizziness.  ?Psychiatric/Behavioral:  Negative for agitation.   ? ?Objective:  ? ?Today's Vitals: BP 130/88   Pulse 81   Ht '5\' 4"'$  (1.626 m)   Wt 138 lb 12.8 oz (63 kg)   SpO2 100%   BMI 23.82 kg/m?  ? ?Physical Exam ?Vitals and nursing note reviewed.  ?Constitutional:   ?   General: She is not in acute distress. ?   Appearance: Normal appearance. She is not ill-appearing.  ?HENT:  ?   Head: Normocephalic and atraumatic.  ?   Right Ear: External ear normal.  ?   Left Ear: External ear normal.  ?   Nose: No congestion.  ?Eyes:  ?   Extraocular Movements: Extraocular movements intact.  ?   Conjunctiva/sclera: Conjunctivae normal.  ?   Pupils: Pupils are equal, round, and reactive to light.  ?Cardiovascular:  ?   Rate and Rhythm: Normal rate and regular rhythm.  ?   Pulses: Normal pulses.  ?   Heart sounds: Normal heart sounds.  ?Pulmonary:  ?   Effort: Pulmonary effort is normal.  ?   Breath sounds: Normal breath sounds. No wheezing.  ?Chest:  ?Breasts: ?   Left: Mass present. No swelling, nipple discharge or tenderness.  ?Abdominal:  ?   General: Bowel sounds are normal.  ?   Palpations: Abdomen is soft.  ?Musculoskeletal:     ?   General: Normal range of motion.  ?   Cervical back: Normal range of motion and neck supple.  ?   Right lower leg: No edema.  ?   Left lower leg: No edema.  ?Skin: ?   General: Skin is warm and dry.  ?   Findings: No bruising.  ?Neurological:  ?   General: No focal deficit present.  ?   Mental Status: She is alert and oriented to person, place, and time.  ?Psychiatric:     ?   Mood and Affect: Mood normal.     ?   Behavior:  Behavior normal.     ?   Thought Content: Thought content normal.  ? ? ? ? ?Assessment & Plan:  ? ?Problem List Items Addressed This Visit   ? ?  ? Cardiovascular and Mediastinum  ? Abdominal migraine, not intractable  ?  Stable, will monitor ? ?  ?  ? Relevant Medications  ? amitriptyline (ELAVIL) 50 MG tablet  ?  ? Other  ?  Body mass index (BMI) 23.0-23.9, adult  ?  Stable, will monitor ? ?  ?  ? Menopausal and perimenopausal disorder  ?  Will refer to GYN for further evaluation ? ?  ?  ? Relevant Orders  ? Ambulatory referral to Gynecology  ? ?Other Visit Diagnoses   ? ? Mass of left breast, unspecified quadrant    -  Primary  ? Relevant Orders  ? MM Digital Diagnostic Unilat L  ? Hemorrhoids, unspecified hemorrhoid type      ? Relevant Orders  ? Ambulatory referral to Gastroenterology  ? CBC with Differential/Platelet  ? Comprehensive metabolic panel ?- can continue OTC preparation H   ? Colon cancer screening      ? Relevant Orders  ? Ambulatory referral to Gastroenterology  ? ?  ? ? ?Follow-up: Return in about 6 months (around 10/26/2022) for Return for Annual Exam with PCP Jimmye Norman.  ? ?Irene Pap, PA-C ?

## 2022-04-25 NOTE — Patient Instructions (Addendum)
You will get a call to schedule an appointment with [facility], Gynecology, and Gastroenterology ? ? ? ?From the early 18s to early 16s women can have perimenopausal symptoms as hormone levels in the body change. Perimenopause is the stage leading up to menopause. The symptoms can start gradually or all of a sudden at any age and can last for a various amount of time, from months to years. There is no quick fix for these symptoms.  ?(The definition of menopause if no menstrual cycle for 12 months in a row. The average age for menopause is usually age 13 or 67.) ? ?Each person can have any or all of the following symptoms: ?Fatigue ?Hot flashes  ?Night sweats ?Abdominal bloating ?Trouble falling and staying asleep ?Mood swings ?Brain fog ?Aches and pains ?Heart palpitations ?Sore breasts ?Weight gain, especially the in the midsection/belly ?Dry, itchy skin including vaginal dryness ?Decreased sexual drive ?Headaches ?Overthinking ?Anxiety ?Depression ? ?There are several over the counter substances that can help the symptoms. Some supplements combine some of the below ingredients. ? ?Over the counter supplements for natural hormonal support: ?Wild yam ?Evening primrose oil ?Black cohosh ?Magnolia bark ?Omega 3s ?DIM ?Magnolia bark ?Red clover ?Chastetree berry ?Maudry Mayhew ? ?Over the counter pamabrom, simethicone, ginger root, probiotics, digestive enzymes, Vitamin D, peppermint oil, or cinnamon oil to help with bloating. ? ?Over the counter magnesium for sleep disturbances and constipation. ? ?Over the counter Ashwagandha, Ginseng or Passion flower to help with stress. ? ?You can also follow up with an ObGyn to ask to have your hormone levels checked and see if prescription hormone replacement is appropriate for you.  ?

## 2022-04-26 LAB — COMPREHENSIVE METABOLIC PANEL
ALT: 15 IU/L (ref 0–32)
AST: 17 IU/L (ref 0–40)
Albumin/Globulin Ratio: 2.1 (ref 1.2–2.2)
Albumin: 4.6 g/dL (ref 3.8–4.9)
Alkaline Phosphatase: 98 IU/L (ref 44–121)
BUN/Creatinine Ratio: 13 (ref 9–23)
BUN: 11 mg/dL (ref 6–24)
Bilirubin Total: <0.2 mg/dL (ref 0.0–1.2)
CO2: 25 mmol/L (ref 20–29)
Calcium: 9.3 mg/dL (ref 8.7–10.2)
Chloride: 102 mmol/L (ref 96–106)
Creatinine, Ser: 0.86 mg/dL (ref 0.57–1.00)
Globulin, Total: 2.2 g/dL (ref 1.5–4.5)
Glucose: 90 mg/dL (ref 70–99)
Potassium: 4.5 mmol/L (ref 3.5–5.2)
Sodium: 140 mmol/L (ref 134–144)
Total Protein: 6.8 g/dL (ref 6.0–8.5)
eGFR: 81 mL/min/{1.73_m2} (ref >59)

## 2022-04-26 LAB — CBC WITH DIFFERENTIAL/PLATELET
Basophils Absolute: 0 10*3/uL (ref 0.0–0.2)
Basos: 1 %
EOS (ABSOLUTE): 0.1 10*3/uL (ref 0.0–0.4)
Eos: 2 %
Hematocrit: 37.9 % (ref 34.0–46.6)
Hemoglobin: 11.7 g/dL (ref 11.1–15.9)
Immature Grans (Abs): 0 10*3/uL (ref 0.0–0.1)
Immature Granulocytes: 0 %
Lymphocytes Absolute: 2.1 10*3/uL (ref 0.7–3.1)
Lymphs: 37 %
MCH: 22 pg — ABNORMAL LOW (ref 26.6–33.0)
MCHC: 30.9 g/dL — ABNORMAL LOW (ref 31.5–35.7)
MCV: 71 fL — ABNORMAL LOW (ref 79–97)
Monocytes Absolute: 0.3 10*3/uL (ref 0.1–0.9)
Monocytes: 6 %
Neutrophils Absolute: 3.1 10*3/uL (ref 1.4–7.0)
Neutrophils: 54 %
Platelets: 310 10*3/uL (ref 150–450)
RBC: 5.32 x10E6/uL — ABNORMAL HIGH (ref 3.77–5.28)
RDW: 16.1 % — ABNORMAL HIGH (ref 11.7–15.4)
WBC: 5.7 10*3/uL (ref 3.4–10.8)

## 2022-04-27 NOTE — Progress Notes (Signed)
Please call patient to tell her-  ? ?Blood count is fine, no anemia ? ?Kidneys and liver are fine

## 2022-04-28 ENCOUNTER — Other Ambulatory Visit: Payer: Self-pay | Admitting: Physician Assistant

## 2022-04-28 MED ORDER — AMITRIPTYLINE HCL 50 MG PO TABS: 50.0000 mg | ORAL_TABLET | Freq: Every day | ORAL | 1 refills | Status: DC

## 2022-04-28 NOTE — Progress Notes (Signed)
Thank you ?Refill of amitriptyline ordered

## 2022-05-08 ENCOUNTER — Ambulatory Visit: Payer: 59 | Admitting: Radiology

## 2022-05-10 ENCOUNTER — Ambulatory Visit (INDEPENDENT_AMBULATORY_CARE_PROVIDER_SITE_OTHER): Payer: 59 | Admitting: Nurse Practitioner

## 2022-05-10 ENCOUNTER — Encounter: Payer: Self-pay | Admitting: Nurse Practitioner

## 2022-05-10 ENCOUNTER — Other Ambulatory Visit (HOSPITAL_COMMUNITY)
Admission: RE | Admit: 2022-05-10 | Discharge: 2022-05-10 | Disposition: A | Discharge status: Home / Self Care | Payer: Commercial Managed Care - HMO | Source: Ambulatory Visit | Attending: Radiology | Admitting: Radiology

## 2022-05-10 VITALS — BP 128/84 | Ht 63.0 in | Wt 137.0 lb

## 2022-05-10 DIAGNOSIS — Z01419 Encounter for gynecological examination (general) (routine) without abnormal findings: Secondary | ICD-10-CM | POA: Insufficient documentation

## 2022-05-10 DIAGNOSIS — Z780 Asymptomatic menopausal state: Secondary | ICD-10-CM

## 2022-05-10 DIAGNOSIS — N6322 Unspecified lump in the left breast, upper inner quadrant: Secondary | ICD-10-CM

## 2022-05-10 NOTE — Patient Instructions (Signed)
Schedule Colonoscopy! ?[facility] ?[phone] ?[address] [place], [place] [address] ? ?

## 2022-05-10 NOTE — Progress Notes (Signed)
   Laura Guzman 1966/08/07 [id number]   History:  56 y.o. [id number] presents as new patient to establish care. Postmenopausal - no HRT, no bleeding. Normal pap history. Recently felt mass in left breast, diagnostic mammogram ordered by PCP, has not been done yet as they are waiting on records from [facility]. Last mammogram in 2018 per patient.   Gynecologic History No LMP recorded. Patient has had an ablation.   Contraception/Family planning: post menopausal status Sexually active: Yes  Health Maintenance Last Pap: 2018. Results were: Normal Last mammogram: 2018. Results were: Normal Last colonoscopy: Never Last Dexa: Not indicated  Past medical history, past surgical history, family history and social history were all reviewed and documented in the EPIC chart. Moved here last year from [place] to care of dad who had MI. 2 older children in [place], 72 yo granddaughter.   ROS:  A ROS was performed and pertinent positives and negatives are included.  Exam:  Vitals:   05/10/22 1328  BP: 128/84  Weight: 137 lb (62.1 kg)  Height: '5\' 3"'$  (1.6 m)   Body mass index is 24.27 kg/m.  General appearance:  Normal Thyroid:  Symmetrical, normal in size, without palpable masses or nodularity. Respiratory  Auscultation:  Clear without wheezing or rhonchi Cardiovascular  Auscultation:  Regular rate, without rubs, murmurs or gallops  Edema/varicosities:  Not grossly evident Abdominal  Soft,nontender, without masses, guarding or rebound.  Liver/spleen:  No organomegaly noted  Hernia:  None appreciated  Skin  Inspection:  Grossly normal Breasts: Examined lying and sitting.   Right: Without masses, retractions, nipple discharge or axillary adenopathy.   Left: 2 cm mobile mass @ 11 o'clock position about 3 cm from nipple. Without retractions, nipple discharge or axillary adenopathy. Genitourinary   Inguinal/mons:  Normal without inguinal adenopathy  External genitalia:  Normal appearing vulva  with no masses, tenderness, or lesions  BUS/Urethra/Skene's glands:  Normal  Vagina:  Normal appearing with normal color and discharge, no lesions  Cervix:  Normal appearing without discharge or lesions  Uterus:  Normal in size, shape and contour.  Midline and mobile, nontender  Adnexa/parametria:     Rt: Normal in size, without masses or tenderness.   Lt: Normal in size, without masses or tenderness.  Anus and perineum: Normal, non-bleeding hemorrhoids  Digital rectal exam: Normal sphincter tone without palpated masses or tenderness  Patient informed chaperone available to be present for breast and pelvic exam. Patient has requested no chaperone to be present. Patient has been advised what will be completed during breast and pelvic exam.   Assessment/Plan:  56 y.o. [id number] to establish care.   Well female exam with routine gynecological exam - Plan: Cytology - PAP( [facility]). Education provided on SBEs, importance of preventative screenings, current guidelines, high calcium diet, regular exercise, and multivitamin daily. Labs with PCP.   Postmenopausal - no HRT, no bleeding  Breast lump on left side at 11 o'clock position - 2 cm mobile mass @ 11 o'clock position about 3 cm from nipple. Likely fibroadenoma. Recommended reaching back out to breast imaging center to check if records were obtained so she can get scheduled.   Screening for cervical cancer - Normal Pap history. Pap with HR HPV today.   Screening for colon cancer - Has not had screening colonoscopy. Due to bleeding hemorrhoids it is recommended she do colonoscopy versus Cologuard. Information provided on [facility].   Return in 1 year for annual.

## 2022-05-12 LAB — CYTOLOGY - PAP
Adequacy: ABSENT
Comment: NEGATIVE
Diagnosis: NEGATIVE
High risk HPV: NEGATIVE

## 2022-06-06 ENCOUNTER — Ambulatory Visit: Payer: Self-pay | Admitting: Nurse Practitioner

## 2022-06-07 ENCOUNTER — Encounter: Payer: Self-pay | Admitting: Nurse Practitioner

## 2022-07-05 ENCOUNTER — Other Ambulatory Visit (INDEPENDENT_AMBULATORY_CARE_PROVIDER_SITE_OTHER): Payer: Commercial Managed Care - HMO

## 2022-07-05 ENCOUNTER — Encounter: Payer: Self-pay | Admitting: Nurse Practitioner

## 2022-07-05 ENCOUNTER — Ambulatory Visit: Payer: Commercial Managed Care - HMO | Admitting: Nurse Practitioner

## 2022-07-05 VITALS — BP 130/76 | HR 75 | Resp 16 | Ht 63.0 in | Wt 139.8 lb

## 2022-07-05 DIAGNOSIS — K625 Hemorrhage of anus and rectum: Secondary | ICD-10-CM | POA: Diagnosis not present

## 2022-07-05 DIAGNOSIS — R718 Other abnormality of red blood cells: Secondary | ICD-10-CM

## 2022-07-05 DIAGNOSIS — Z1211 Encounter for screening for malignant neoplasm of colon: Secondary | ICD-10-CM

## 2022-07-05 LAB — IBC + FERRITIN
Ferritin: 18.4 ng/mL (ref 10.0–291.0)
Iron: 100 ug/dL (ref 42–145)
Saturation Ratios: 24 % (ref 20.0–50.0)
TIBC: 417.2 ug/dL (ref 250.0–450.0)
Transferrin: 298 mg/dL (ref 212.0–360.0)

## 2022-07-05 MED ORDER — NA SULFATE-K SULFATE-MG SULF 17.5-3.13-1.6 GM/177ML PO SOLN: 1.0000 | Freq: Once | ORAL | 0 refills | Status: AC

## 2022-07-05 NOTE — Patient Instructions (Addendum)
Hemorrhoids Continue Prep H since it helps. Apply inside rectum  1-2 times daily for 5-7 days .  Your provider has requested that you go to the basement level for lab work before leaving today. Press "B" on the elevator. The lab is located at the first door on the left as you exit the elevator.   You have been scheduled for a colonoscopy. Please follow written instructions given to you at your visit today.  Please pick up your prep supplies at the pharmacy within the next 1-3 days. If you use inhalers (even only as needed), please bring them with you on the day of your procedure.   If you are age 54 or older, your body mass index should be between 23-30. Your Body mass index is 24.76 kg/m. If this is out of the aforementioned range listed, please consider follow up with your Primary Care Provider.  If you are age 1 or younger, your body mass index should be between 19-25. Your Body mass index is 24.76 kg/m. If this is out of the aformentioned range listed, please consider follow up with your Primary Care Provider.   ________________________________________________________  The [facility] providers would like to encourage you to use [facility] to communicate with providers for non-urgent requests or questions.  Due to long hold times on the telephone, sending your provider a message by [facility] may be a faster and more efficient way to get a response.  Please allow 48 business hours for a response.  Please remember that this is for non-urgent requests.  _______________________________________________________

## 2022-07-05 NOTE — Progress Notes (Signed)
Chief Complaint:  colon cancer screening    Assessment & Plan   # Colon cancer screening.  No family history of colon cancer Schedule for a colonoscopy. The risks and benefits of colonoscopy with possible polypectomy / biopsies were discussed and the patient agrees to proceed.   # Chronic painless rectal bleeding. She has a history of hemorrhoids Continue Prep H since it helps. Apply inside rectum one to two times daily for 5-7 days.  Advised not to use continuously on a daily basis.   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  Review of her labs show that this dates back greater than 10 years.  MCV tends to run in the upper 60s to low 70s.   Obtain iron studies to make sure she is not iron deficient  # History of abdominal migraines.  For years she suffered with nausea,  vomiting and epigastric pain.  Sounds like she had an unremarkable EGD in 2015 in [place].  She was ultimately diagnosed with abdominal migraines,  started on Elavil and has been symptom-free since  HPI:     Patient referred by PCP for colon cancer screening and evaluation of hemorrhoids.  She has a history of abdominal migraines and is maintained on Elavil.   No Bath of colon cancer. No prior colon cancer screening. She has been having rectal bleeding with bowel movements for 2-3 years. Stools are soft. She doesn't strain. Stools are not hard. She has a BM 2-3 times a week. Has tender hemorrhoid but otherwise no rectal pain. She has used prep H off and on which helps.   She gives a history of abdominal migraines with abdominal pain and nausea and vomiting. Her symptoms last for years. In 2015 in [place] she was diagnosed with abdominal migraines, started on Elavil and no problems since.   She had an endometrial ablation in 2016, no other surgeries. No menses in several years. She rarely consumes Etoh. Non -smoker.   Previous Labs / Imaging::    Latest Ref Rng & Units 04/25/2022    9:47 AM 11/30/2015    8:20 AM  04/19/2015    8:21 PM  CBC  WBC 3.4 - 10.8 x10E3/uL 5.7  6.4  5.3   Hemoglobin 11.1 - 15.9 g/dL 11.7  12.0  15.7   Hematocrit 34.0 - 46.6 % 37.9  36.2  44.1   Platelets 150 - 450 x10E3/uL 310  230  233       Latest Ref Rng & Units 04/25/2022    9:47 AM 04/19/2015    8:21 PM 08/03/2014    9:32 AM  CMP  Glucose 70 - 99 mg/dL 90  124  84   BUN 6 - 24 mg/dL '11  28  13   '$ Creatinine 0.57 - 1.00 mg/dL 0.86  1.18  0.73   Sodium 134 - 144 mmol/L 140  135  140   Potassium 3.5 - 5.2 mmol/L 4.5  3.1  4.4   Chloride 96 - 106 mmol/L 102  102  105   CO2 20 - 29 mmol/L '25  20  28   '$ Calcium 8.7 - 10.2 mg/dL 9.3  9.4  9.1   Total Protein 6.0 - 8.5 g/dL 6.8  8.3  6.4   Total Bilirubin 0.0 - 1.2 mg/dL <0.2  0.8  0.2   Alkaline Phos 44 - 121 IU/L 98  61  61   AST 0 - 40 IU/L '17  19  21   '$ ALT 0 - 32  IU/L '15  17  21     '$ Past Medical History:  Diagnosis Date   Hyperemesis    This is called cyclic vomitting syndrome   Migraines    Abdominal migraine   Past Surgical History:  Procedure Laterality Date   ENDOMETRIAL ABLATION     HYSTEROSCOPY N/A 11/30/2015   Procedure: HYSTEROSCOPY WITH HYDROTHERMAL ABLATION;  Surgeon: Mora Bellman, MD;  Location: [facility];  Service: Gynecology;  Laterality: N/A;   NO PAST SURGERIES     Family History  Problem Relation Age of Onset   Hypertension Mother    Hyperlipidemia Mother    Kidney disease Mother    Heart attack Father    Hypertension Father    Kidney disease Father    Colon cancer Neg Hx    CVA Neg Hx    Prostate cancer Neg Hx    Breast cancer Neg Hx    Diabetes type II Neg Hx    Social History   Tobacco Use   Smoking status: Former    Packs/day: 0.20    Years: 1.00    Total pack years: 0.20    Types: Cigarettes    Start date: 07/23/2012    Quit date: 07/23/2013    Years since quitting: 8.9   Smokeless tobacco: Never  Substance Use Topics   Alcohol use: Yes    Comment: social, ocassional   Drug use: Not Currently   Current Outpatient  Medications  Medication Sig Dispense Refill   amitriptyline (ELAVIL) 50 MG tablet Take 1 tablet (50 mg total) by mouth at bedtime. 90 tablet 1   Multiple Vitamin (MULTIVITAMIN) tablet Take 1 tablet by mouth daily.     No current facility-administered medications for this visit.   Allergies  Allergen Reactions   Compazine [Prochlorperazine Edisylate] Swelling    States "jaw locked down"     Review of Systems: Positive for night sweats.  All other systems reviewed and negative except where noted in HPI.   Wt Readings from Last 3 Encounters:  05/10/22 137 lb (62.1 kg)  04/25/22 138 lb 12.8 oz (63 kg)  12/31/15 116 lb 6.4 oz (52.8 kg)    Physical Exam   BP 130/76 (BP Location: Right Arm, Patient Position: Sitting, Cuff Size: Normal)   Pulse 75   Resp 16   Ht '5\' 3"'$  (1.6 m)   Wt 139 lb 12.8 oz (63.4 kg)   SpO2 99%   BMI 24.76 kg/m  Constitutional:  Generally well appearing female in no acute distress. Psychiatric: Pleasant. Normal mood and affect. Behavior is normal. EENT: Pupils normal.  Conjunctivae are normal. No scleral icterus. Neck supple.  Cardiovascular: Normal rate, regular rhythm. No edema Pulmonary/chest: Effort normal and breath sounds normal. No wheezing, rales or rhonchi. Abdominal: Soft, nondistended, nontender. Bowel sounds active throughout. There are no masses palpable. No hepatomegaly. Neurological: Alert and oriented to person place and time. Skin: Skin is warm and dry. No rashes noted.  Tye Savoy, NP  07/05/2022, 8:19 AM  Cc:  Referring Provider Irene Pap, PA-C

## 2022-07-05 NOTE — Progress Notes (Signed)
Attending Physician's Attestation   I have reviewed the chart.   I agree with the Advanced Practitioner's note, impression, and recommendations with any updates as below. Agree with endoscopic evaluation as outlined.  If evidence of iron deficiency is found, recommend upper endoscopy be added to colonoscopy.   Justice Britain, MD [facility] Advanced Endoscopy Office # [phone]

## 2022-07-06 ENCOUNTER — Telehealth: Payer: Self-pay | Admitting: Nurse Practitioner

## 2022-07-06 ENCOUNTER — Telehealth: Payer: Self-pay

## 2022-07-06 ENCOUNTER — Other Ambulatory Visit: Payer: Self-pay

## 2022-07-06 DIAGNOSIS — Z1211 Encounter for screening for malignant neoplasm of colon: Secondary | ICD-10-CM

## 2022-07-06 DIAGNOSIS — K625 Hemorrhage of anus and rectum: Secondary | ICD-10-CM

## 2022-07-06 DIAGNOSIS — D509 Iron deficiency anemia, unspecified: Secondary | ICD-10-CM

## 2022-07-06 MED ORDER — ONDANSETRON HCL 4 MG PO TABS: 4.0000 mg | ORAL_TABLET | Freq: Three times a day (TID) | ORAL | 0 refills | Status: DC | PRN

## 2022-07-06 NOTE — Telephone Encounter (Signed)
Pt states she has some baseline nausea with one of the medications she is on and was wondering if she could get zofran called in to prevent nausea while drinking the colonoscopy prep.

## 2022-07-06 NOTE — Telephone Encounter (Signed)
Patient has an upcoming procedure for 7/26. Patient is calling to see if she can be prescribed Zofran to help with anxiety prior to upcoming surgery. Please give patient a call back to further advise.  Thank you

## 2022-07-06 NOTE — Telephone Encounter (Signed)
-----   Message from Willia Craze, NP sent at 07/06/2022  9:01 AM EDT ----- Laura Guzman, please let patient know that her iron stores are on the low side.  Iron deficiency can result from gastrointestinal blood loss.  She is scheduled for a colonoscopy.  Would you talk with her about adding on an upper endoscopy at the same time (the date may need to be changed).  Thanks

## 2022-07-06 NOTE — Telephone Encounter (Signed)
Spoke with pt and pt agreed to adding EGD to colonoscopy. Procedure moved to 07/25/22 in order to add EGD. Instructions updated and sent to [facility] and mailed.

## 2022-07-06 NOTE — Telephone Encounter (Signed)
Yes, please call in Zofran 4 mg 1 tablet every 8 hours as needed for nausea, # 20. Thanks

## 2022-07-06 NOTE — Telephone Encounter (Signed)
Zofran sent to pharmacy. Called pt to let her know.

## 2022-07-12 ENCOUNTER — Encounter: Payer: Commercial Managed Care - HMO | Admitting: Gastroenterology

## 2022-07-25 ENCOUNTER — Ambulatory Visit (AMBULATORY_SURGERY_CENTER): Payer: Commercial Managed Care - HMO | Admitting: Gastroenterology

## 2022-07-25 ENCOUNTER — Encounter: Payer: Self-pay | Admitting: Gastroenterology

## 2022-07-25 VITALS — BP 155/97 | HR 77 | Temp 98.7°F | Resp 16 | Ht 64.0 in | Wt 139.0 lb

## 2022-07-25 DIAGNOSIS — D128 Benign neoplasm of rectum: Secondary | ICD-10-CM

## 2022-07-25 DIAGNOSIS — K31A0 Gastric intestinal metaplasia, unspecified: Secondary | ICD-10-CM | POA: Diagnosis not present

## 2022-07-25 DIAGNOSIS — K635 Polyp of colon: Secondary | ICD-10-CM | POA: Diagnosis not present

## 2022-07-25 DIAGNOSIS — D122 Benign neoplasm of ascending colon: Secondary | ICD-10-CM

## 2022-07-25 DIAGNOSIS — K2950 Unspecified chronic gastritis without bleeding: Secondary | ICD-10-CM | POA: Diagnosis not present

## 2022-07-25 DIAGNOSIS — Z1211 Encounter for screening for malignant neoplasm of colon: Secondary | ICD-10-CM

## 2022-07-25 DIAGNOSIS — K625 Hemorrhage of anus and rectum: Secondary | ICD-10-CM

## 2022-07-25 DIAGNOSIS — K3189 Other diseases of stomach and duodenum: Secondary | ICD-10-CM | POA: Diagnosis not present

## 2022-07-25 DIAGNOSIS — D509 Iron deficiency anemia, unspecified: Secondary | ICD-10-CM

## 2022-07-25 DIAGNOSIS — K222 Esophageal obstruction: Secondary | ICD-10-CM

## 2022-07-25 DIAGNOSIS — K2980 Duodenitis without bleeding: Secondary | ICD-10-CM | POA: Diagnosis not present

## 2022-07-25 DIAGNOSIS — D125 Benign neoplasm of sigmoid colon: Secondary | ICD-10-CM

## 2022-07-25 DIAGNOSIS — K621 Rectal polyp: Secondary | ICD-10-CM | POA: Diagnosis not present

## 2022-07-25 DIAGNOSIS — K259 Gastric ulcer, unspecified as acute or chronic, without hemorrhage or perforation: Secondary | ICD-10-CM | POA: Diagnosis not present

## 2022-07-25 DIAGNOSIS — R718 Other abnormality of red blood cells: Secondary | ICD-10-CM

## 2022-07-25 MED ORDER — FENTANYL CITRATE (PF) 100 MCG/2ML IJ SOLN
25.0000 ug | Freq: Once | INTRAMUSCULAR | Status: AC
  Administered 2022-07-25 (×2): 25 ug via INTRAVENOUS

## 2022-07-25 MED ORDER — HYDROCORTISONE ACETATE 25 MG RE SUPP: RECTAL | 0 refills | Status: DC

## 2022-07-25 MED ORDER — SODIUM CHLORIDE 0.9 % IV SOLN: 500.0000 mL | INTRAVENOUS | Status: DC

## 2022-07-25 MED ORDER — SODIUM CHLORIDE 0.9 % IV SOLN
4.0000 mg | Freq: Once | INTRAVENOUS | Status: AC
  Administered 2022-07-25: 4 mg via INTRAVENOUS

## 2022-07-25 MED ORDER — OMEPRAZOLE 40 MG PO CPDR: DELAYED_RELEASE_CAPSULE | ORAL | 1 refills | Status: DC

## 2022-07-25 NOTE — Op Note (Signed)
[facility] Patient Name: Laura Guzman Procedure Date: 07/25/2022 2:50 PM MRN: [id number] Endoscopist: Justice Britain , MD Age: 56 Referring MD:  Date of Birth: 10-Jul-1966 Gender: Female Account #: [id number] Procedure:                Colonoscopy Indications:              Screening for colorectal malignant neoplasm,                            Incidental - Rectal bleeding Medicines:                Monitored Anesthesia Care Procedure:                Pre-Anesthesia Assessment:                           - Prior to the procedure, a History and Physical                            was performed, and patient medications and                            allergies were reviewed. The patient's tolerance of                            previous anesthesia was also reviewed. The risks                            and benefits of the procedure and the sedation                            options and risks were discussed with the patient.                            All questions were answered, and informed consent                            was obtained. Prior Anticoagulants: The patient has                            taken no previous anticoagulant or antiplatelet                            agents. ASA Grade Assessment: II - A patient with                            mild systemic disease. After reviewing the risks                            and benefits, the patient was deemed in                            satisfactory condition to undergo the procedure.  After obtaining informed consent, the colonoscope                            was passed under direct vision. Throughout the                            procedure, the patient's blood pressure, pulse, and                            oxygen saturations were monitored continuously. The                            Olympus PCF-H190DL [id number]) Colonoscope was                            introduced through the anus and  advanced to the the                            cecum, identified by appendiceal orifice and                            ileocecal valve. The colonoscopy was somewhat                            difficult due to poor bowel prep and a redundant                            colon. Successful completion of the procedure was                            aided by changing the patient's position, using                            manual pressure, straightening and shortening the                            scope to obtain bowel loop reduction, using scope                            torsion and lavage. The patient tolerated the                            procedure. The quality of the bowel preparation was                            poor. The ileocecal valve, appendiceal orifice, and                            rectum were photographed. Scope In: 3:05:42 PM Scope Out: 3:30:29 PM Scope Withdrawal Time: 0 hours 17 minutes 48 seconds  Total Procedure Duration: 0 hours 24 minutes 47 seconds  Findings:                 The digital rectal exam findings include  hemorrhoids. Pertinent negatives include no                            palpable rectal lesions.                           The colon (entire examined portion) revealed                            grossly excessive looping as a result of redundancy.                           Copious quantities of semi-liquid stool was found                            in the entire colon, interfering with                            visualization. Lavage of the area was performed                            using copious amounts, resulting in incomplete                            clearance with fair visualization.                           Seven sessile polyps were found in the rectum (2),                            sigmoid colon (2) and ascending colon (3). The                            polyps were 3 to 10 mm in size. These polyps were                             removed with a cold snare. Resection and retrieval                            were complete.                           Bleeding prolapsed external and internal                            hemorrhoids were found during retroflexion, during                            perianal exam and during digital exam. The                            hemorrhoids were Grade III (internal hemorrhoids                            that prolapse but  require manual reduction). Complications:            No immediate complications. Estimated Blood Loss:     Estimated blood loss was minimal. Impression:               - Preparation of the colon was poor.                           - Hemorrhoids found on digital rectal exam.                           - There was significant looping of the colon from                            significant redundancy.                           - Stool in the entire examined colon - lavaged with                            persistently fair preparation.                           - Seven 3 to 10 mm polyps in the rectum, in the                            sigmoid colon and in the ascending colon, removed                            with a cold snare. Resected and retrieved.                           - Bleeding prolapsed external and internal                            hemorrhoids. Recommendation:           - The patient will be observed post-procedure,                            until all discharge criteria are met.                           - Discharge patient to home.                           - Patient has a contact number available for                            emergencies. The signs and symptoms of potential                            delayed complications were discussed with the                            patient. Return to normal activities tomorrow.  Written discharge instructions were provided to the                            patient.                            - High fiber diet.                           - Use FiberCon 1-2 tablets PO daily.                           - Continue present medications.                           - Await pathology results.                           - Repeat colonoscopy in 1 year because the bowel                            preparation was poor. 2-day preparation will be                            needed.                           - Discussion in clinic follow up will be                            reasonable, since I think she is dealing with more                            constipation issues than we previously thought.                           - Referral to Colorectal surgery to discuss next                            steps in treatment/evaluation of hemorrhoids.                           - Anusol QHS x 1 week and every other night until                            Rx is done.                           - Preparation H 1-2 times daily.                           - Sitz baths QHS.                           - The findings and recommendations were discussed  with the patient.                           - The findings and recommendations were discussed                            with the patient's family. Justice Britain, MD 07/25/2022 3:56:38 PM

## 2022-07-25 NOTE — Progress Notes (Signed)
A and O x3. Report to RN. Tolerated MAC anesthesia well.Teeth unchanged after procedure. 

## 2022-07-25 NOTE — Op Note (Signed)
[facility] Patient Name: Laura Guzman Procedure Date: 07/25/2022 2:51 PM MRN: [id number] Endoscopist: Justice Britain , MD Age: 56 Referring MD:  Date of Birth: Mar 03, 1966 Gender: Female Account #: [id number] Procedure:                Upper GI endoscopy Indications:              Iron deficiency anemia Medicines:                Monitored Anesthesia Care Procedure:                Pre-Anesthesia Assessment:                           - Prior to the procedure, a History and Physical                            was performed, and patient medications and                            allergies were reviewed. The patient's tolerance of                            previous anesthesia was also reviewed. The risks                            and benefits of the procedure and the sedation                            options and risks were discussed with the patient.                            All questions were answered, and informed consent                            was obtained. Prior Anticoagulants: The patient has                            taken no previous anticoagulant or antiplatelet                            agents. ASA Grade Assessment: II - A patient with                            mild systemic disease. After reviewing the risks                            and benefits, the patient was deemed in                            satisfactory condition to undergo the procedure.                           After obtaining informed consent, the endoscope was  passed under direct vision. Throughout the                            procedure, the patient's blood pressure, pulse, and                            oxygen saturations were monitored continuously. The                            GIF [id number] was introduced through the                            mouth, and advanced to the second part of duodenum.                            The upper GI endoscopy was  accomplished without                            difficulty. The patient tolerated the procedure. Scope In: Scope Out: Findings:                 No gross mucosal lesions were noted in the entire                            esophagus.                           One benign-appearing, intrinsic moderate                            (circumferential scarring or stenosis; an endoscope                            may pass) stenosis was found 33 cm from the                            incisors. This stenosis measured 1.3 cm (inner                            diameter) x less than one cm (in length). The                            stenosis was traversed.                           One benign-appearing, intrinsic moderate                            (circumferential scarring or stenosis; an endoscope                            may pass) stenosis was found 35 cm from the                            incisors. This stenosis measured 9 mm (  inner                            diameter) x less than one cm (in length). The                            stenosis was traversed with gentle pressure and led                            to a mucosal wrent.                           The Z-line was regular and was found 35 cm from the                            incisors.                           A 1 cm hiatal hernia was present.                           Multiple dispersed small erosions with no bleeding                            and no stigmata of recent bleeding were found in                            the gastric antrum.                           No other gross lesions were noted in the entire                            examined stomach. Biopsies were taken with a cold                            forceps for histology and Helicobacter pylori                            testing.                           No gross lesions were noted in the duodenal bulb,                            in the first portion of the duodenum and in  the                            second portion of the duodenum. Biopsies were taken                            with a cold forceps for histology. Complications:            No immediate complications. Estimated Blood Loss:     Estimated blood loss was minimal. Impression:               -  No gross mucosal lesions in esophagus.                           - Benign-appearing esophageal stenosis at 33 cm.                           - Benign-appearing esophageal stenosis at 35 cm -                            traversed and mucosal wrent noted.                           - Z-line regular, 36 cm from the incisors.                           - 1 cm hiatal hernia.                           - Erosive gastropathy with no bleeding and no                            stigmata of recent bleeding in antrum. No other                            gross lesions in the stomach. Biopsied.                           - No gross lesions in the duodenal bulb, in the                            first portion of the duodenum and in the second                            portion of the duodenum. Biopsied. Recommendation:           - Proceed to scheduled colonoscopy.                           - Dilation diet as per protocol.                           - Await pathology results.                           - Start Omeprazole 40 mg twice daily for 56-month                            and then transition to once daily.                           - If dysphagia symptoms develop in future, she will                            benefit from dilation.                           -  Observe patient's clinical course.                           - The findings and recommendations were discussed                            with the patient.                           - The findings and recommendations were discussed                            with the patient's family. Justice Britain, MD 07/25/2022 3:43:53 PM

## 2022-07-25 NOTE — Progress Notes (Signed)
SEE DR MANSOURATYS NOTE AND COMMENTS IN RECOVERY FLOW SHEETS FOR PAIN SCALE RATINGS PER PATIENT AS WELL AS ZOFRAN AND FENTANYL TIMES GIVEN .   1655 PT ASSISTED TO BATHROOM BY T LINK AND B CAMERON AS ORDERED BY DR Rush Landmark

## 2022-07-25 NOTE — Progress Notes (Signed)
Patient evaluated in the recovery area. Discussed the EGD/colonoscopy findings. She is having inability of passage of gas at this point in time and having abdominal distention with discomfort. She has been placed in left lateral position and then right lateral position and now placed into the all 4 position. Has not passed significant mount of gas as of yet. Will plan to attempt Levsin dosing and give a single dose of fentanyl IV 25 mcg x 1. If patient still having issues will try to perform a stimulating digital rectal in an effort of trying to have her pass more gas. If further issues, will consider potential repeat sigmoidoscopy to try and remove further air. If pain is persisting or progressing she may need to go to the emergency department for further evaluation.   Justice Britain, MD [facility] Office # [phone]   Addendum Patient has received a total of 50 mcg of IV fentanyl. She is also received a second dose of Zofran for total of 8 mg Zofran IV. Discomfort is down to a 2-3 out of 10. Physical exam consistent with hyperactive bowel sounds and a much softer abdomen now that she is passed gas from above and below. Patient describes that she has experienced abdominal discomfort in the upper abdomen at times with associated nausea that has a similar type of discomfort as she is experienced previously.  She feels this is getting closer to what she is experienced in the past rather than being more acute change. We are going to allow the patient a bit more time in recovery and then help her to the restroom to see if sitting on the toilet may help with some of her discomfort. We will reevaluate for the potential of discharge from our unit versus if she needs to be taken to the emergency department for further evaluation to exclude a postprocedural complication. Patient and patient's dad have been updated and agree with this plan of action.  Justice Britain, MD [facility] Office # [phone]   Addendum Patient's history reviewed further and she describes a history of abdominal migraines.  It is likely that we have exacerbated some of her previous symptoms in the setting of her redundant colon and need for longer O2 within the bowel.  She is currently in the restroom and we will see how she feels in the coming minutes.   Justice Britain, MD [facility] Office # [phone]   5:10 PM addendum Patient reevaluated.  Her abdomen is much softer.  She has normal active bowel sounds.  She is no longer experiencing the abdominal discomfort that she has had over the course of the last 45 minutes and is experiencing the exact type of pain she has had previously when she has dealt with her abdominal migraines.  She had not felt this in quite a while but she says this is characteristic of the symptoms of nausea and vomiting and abdominal pain.  She has been hemodynamically stable throughout this time.  She has been able to walk with assistance to the bed and to the bathroom.  At this point we offered the patient potential discharge home with close monitoring versus continued evaluation and monitoring in the emergency department.  She feels comfortable about where things stand at this point that she knows to call if things progress or worsen but she feels comfortable with discharge since this is similar to the type of pain she has had in the past.  I am  comfortable with this plan of action due to her history and her hemodynamic stability.  She and her father know that if things worsen or progress she will need to call our service and understand the neck steps in her evaluation will be emergency department evaluation.  I will have my team reach out to her tomorrow to see how she is doing in the morning and also wish her a happy birthday.  Appreciate my medical team this afternoon with  assistance in helping our patient get through her post procedure time.   Justice Britain, MD [facility] Office # [phone]

## 2022-07-25 NOTE — Progress Notes (Signed)
GASTROENTEROLOGY PROCEDURE H&P NOTE   Primary Care Physician: Irene Pap, PA-C  HPI: Laura Guzman is a 56 y.o. female who presents for EGD/colonoscopy for evaluation of iron insufficiency/deficiency and colon cancer screening with prior rectal bleeding (painless).  Past Medical History:  Diagnosis Date   Hyperemesis    This is called cyclic vomitting syndrome   Migraines    Abdominal migraine   Past Surgical History:  Procedure Laterality Date   ENDOMETRIAL ABLATION     HYSTEROSCOPY N/A 11/30/2015   Procedure: HYSTEROSCOPY WITH HYDROTHERMAL ABLATION;  Surgeon: Mora Bellman, MD;  Location: [facility];  Service: Gynecology;  Laterality: N/A;   NO PAST SURGERIES     Current Outpatient Medications  Medication Sig Dispense Refill   amitriptyline (ELAVIL) 50 MG tablet Take 1 tablet (50 mg total) by mouth at bedtime. 90 tablet 1   Multiple Vitamin (MULTIVITAMIN) tablet Take 1 tablet by mouth daily.     ondansetron (ZOFRAN) 4 MG tablet Take 1 tablet (4 mg total) by mouth every 8 (eight) hours as needed for nausea or vomiting. 20 tablet 0   Current Facility-Administered Medications  Medication Dose Route Frequency Provider Last Rate Last Admin   0.9 %  sodium chloride infusion  500 mL Intravenous Continuous Mansouraty, Telford Nab., MD        Current Outpatient Medications:    amitriptyline (ELAVIL) 50 MG tablet, Take 1 tablet (50 mg total) by mouth at bedtime., Disp: 90 tablet, Rfl: 1   Multiple Vitamin (MULTIVITAMIN) tablet, Take 1 tablet by mouth daily., Disp: , Rfl:    ondansetron (ZOFRAN) 4 MG tablet, Take 1 tablet (4 mg total) by mouth every 8 (eight) hours as needed for nausea or vomiting., Disp: 20 tablet, Rfl: 0  Current Facility-Administered Medications:    0.9 %  sodium chloride infusion, 500 mL, Intravenous, Continuous, Mansouraty, Telford Nab., MD Allergies  Allergen Reactions   Compazine [Prochlorperazine Edisylate] Swelling    States "jaw locked down"    Family History  Problem Relation Age of Onset   Hypertension Mother    Hyperlipidemia Mother    Kidney disease Mother    Heart attack Father    Hypertension Father    Kidney disease Father    Colon cancer Neg Hx    CVA Neg Hx    Prostate cancer Neg Hx    Breast cancer Neg Hx    Diabetes type II Neg Hx    Rectal cancer Neg Hx    Stomach cancer Neg Hx    Liver cancer Neg Hx    Pancreatic cancer Neg Hx    Social History   Socioeconomic History   Marital status: Single    Spouse name: Not on file   Number of children: Not on file   Years of education: Not on file   Highest education level: Not on file  Occupational History   Not on file  Tobacco Use   Smoking status: Former    Packs/day: 0.20    Years: 1.00    Total pack years: 0.20    Types: Cigarettes    Start date: 07/23/2012    Quit date: 07/23/2013    Years since quitting: 9.0   Smokeless tobacco: Never  Substance and Sexual Activity   Alcohol use: Yes    Comment: social, ocassional   Drug use: Not Currently   Sexual activity: Yes  Other Topics Concern   Not on file  Social History Narrative   Not on file  Social Determinants of Health   Financial Resource Strain: Not on file  Food Insecurity: Not on file  Transportation Needs: Not on file  Physical Activity: Not on file  Stress: Not on file  Social Connections: Not on file  Intimate Partner Violence: Not on file    Physical Exam: Today's Vitals   07/25/22 1355  BP: (!) 140/94  Pulse: 89  Temp: 98.7 F (37.1 C)  TempSrc: Temporal  SpO2: 100%  Weight: 139 lb (63 kg)  Height: '5\' 4"'$  (1.626 m)   Body mass index is 23.86 kg/m. GEN: NAD EYE: Sclerae anicteric ENT: MMM CV: Non-tachycardic GI: Soft, NT/ND NEURO:  Alert & Oriented x 3  Lab Results: No results for input(s): "WBC", "HGB", "HCT", "PLT" in the last 72 hours. BMET No results for input(s): "NA", "K", "CL", "CO2", "GLUCOSE", "BUN", "CREATININE", "CALCIUM" in the last 72  hours. LFT No results for input(s): "PROT", "ALBUMIN", "AST", "ALT", "ALKPHOS", "BILITOT", "BILIDIR", "IBILI" in the last 72 hours. PT/INR No results for input(s): "LABPROT", "INR" in the last 72 hours.   Impression / Plan: This is a 56 y.o.female who presents for EGD/colonoscopy for evaluation of iron insufficiency/deficiency and colon cancer screening with prior rectal bleeding (painless).  The risks and benefits of endoscopic evaluation/treatment were discussed with the patient and/or family; these include but are not limited to the risk of perforation, infection, bleeding, missed lesions, lack of diagnosis, severe illness requiring hospitalization, as well as anesthesia and sedation related illnesses.  The patient's history has been reviewed, patient examined, no change in status, and deemed stable for procedure.  The patient and/or family is agreeable to proceed.    Justice Britain, MD [facility] Office # [phone]

## 2022-07-25 NOTE — Patient Instructions (Addendum)
Colonoscopy :   Follow high fiber diet   Use FiberCon 1-2 tablets by mouth daily  Repeat Colonoscopy in one year due to poor prep -need 2 day prep   Refer to Colorectal surgery to discuss treatment for hemorrhoids   Anusol suppositiry every night for 1 week ,then every other night  until finished - SENT ORDER TO YOUR PHARMACY  Preparation H 1-2 times daily - this is purchased over the counter  Sitz baths every night   HANDOUT [facility]   AWAIT PATHOLOGY RESULTS ON POLYPS REMOVED        UPPER ENDOSCOPY:  FOLLOW DILATATION DIET ( HANDOUT GIVEN TO YOU )  OMEPRAZOLE 40 MG TWICE DAILY FOR 2 MONTHS ,THEN ONCE DAILY- SENT ORDER TO YOUR PHARMACY    YOU HAD AN ENDOSCOPIC PROCEDURE TODAY AT [facility]:   Refer to the procedure report that was given to you for any specific questions about what was found during the examination.  If the procedure report does not answer your questions, please call your gastroenterologist to clarify.  If you requested that your care partner not be given the details of your procedure findings, then the procedure report has been included in a sealed envelope for you to review at your convenience later.  YOU SHOULD EXPECT: Some feelings of bloating in the abdomen. Passage of more gas than usual.  Walking can help get rid of the air that was put into your GI tract during the procedure and reduce the bloating. If you had a lower endoscopy (such as a colonoscopy or flexible sigmoidoscopy) you may notice spotting of blood in your stool or on the toilet paper. If you underwent a bowel prep for your procedure, you may not have a normal bowel movement for a few days.  Please Note:  You might notice some irritation and congestion in your nose or some drainage.  This is from the oxygen used during your procedure.  There is no need for concern and it should clear up in a day or so.  SYMPTOMS TO REPORT  IMMEDIATELY:  Following lower endoscopy (colonoscopy or flexible sigmoidoscopy):  Excessive amounts of blood in the stool  Significant tenderness or worsening of abdominal pains  Swelling of the abdomen that is new, acute  Fever of 100F or higher  Following upper endoscopy (EGD)  Vomiting of blood or coffee ground material  New chest pain or pain under the shoulder blades  Painful or persistently difficult swallowing  New shortness of breath  Fever of 100F or higher  Black, tarry-looking stools  For urgent or emergent issues, a gastroenterologist can be reached at any hour by calling [phone]. Do not use MyChart messaging for urgent concerns.    DIET:  We do recommend a small meal at first, but then you may proceed to your regular diet.  Drink plenty of fluids but you should avoid alcoholic beverages for 24 hours.  ACTIVITY:  You should plan to take it easy for the rest of today and you should NOT DRIVE or use heavy machinery until tomorrow (because of the sedation medicines used during the test).    FOLLOW UP: Our staff will call the number listed on your records the next business day following your procedure.  We will call around 7:15- 8:00 am to check on you and address any questions or concerns that you may have regarding the information given to you following your procedure. If we do  not reach you, we will leave a message.  If you develop any symptoms (ie: fever, flu-like symptoms, shortness of breath, cough etc.) before then, please call [phone].  If you test positive for Covid 19 in the 2 weeks post procedure, please call and report this information to [place].    If any biopsies were taken you will be contacted by phone or by letter within the next 1-3 weeks.  Please call us at [phone] if you have not heard about the biopsies in 3 weeks.    SIGNATURES/CONFIDENTIALITY: You and/or your care partner have signed paperwork which will be entered into your electronic  medical record.  These signatures attest to the fact that that the information above on your After Visit Summary has been reviewed and is understood.  Full responsibility of the confidentiality of this discharge information lies with you and/or your care-partner.

## 2022-07-25 NOTE — Progress Notes (Signed)
Pt's states no medical or surgical changes since previsit or office visit. 

## 2022-07-25 NOTE — Progress Notes (Signed)
Called to room to assist during endoscopic procedure.  Patient ID and intended procedure confirmed with present staff. Received instructions for my participation in the procedure from the performing physician.  

## 2022-07-26 ENCOUNTER — Telehealth: Payer: Self-pay

## 2022-07-26 ENCOUNTER — Telehealth: Payer: Self-pay | Admitting: *Deleted

## 2022-07-26 NOTE — Telephone Encounter (Signed)
Great. I also called her and wished her a happy birthday this afternoon. I would go ahead and put her in for a Colorectal Surgery referral, for hemorrhoidal bleeding/treatment options. We will plan to see her back for repeat Colonoscopy in the next 6-12 months with a 2-day preparation. Thanks. GM

## 2022-07-26 NOTE — Telephone Encounter (Signed)
  Follow up Call-     07/25/2022    1:56 PM  Call back number  Post procedure Call Back phone  # [phone]  Permission to leave phone message Yes     Patient questions:  Do you have a fever, pain , or abdominal swelling? No. Pain Score  0 *  Have you tolerated food without any problems? Yes.    Have you been able to return to your normal activities? Yes.    Do you have any questions about your discharge instructions: Diet   No. Medications  No. Follow up visit  No.  Do you have questions or concerns about your Care? No.  Actions: * If pain score is 4 or above: No action needed, pain <4.

## 2022-07-26 NOTE — Telephone Encounter (Signed)
[facility] referral has been made and recall entered

## 2022-07-26 NOTE — Telephone Encounter (Signed)
The pt states that she is doing much better, no complaints.  She says that she appreciates all the care she received and the phone call today.   She will call if her symptoms return.

## 2022-07-26 NOTE — Telephone Encounter (Signed)
-----   Message from Irving Copas., MD sent at 07/25/2022  5:13 PM EDT ----- Regarding: Follow-up Laura Guzman, Please reach out to the patient tomorrow morning and see how she is doing post colonoscopy. She had a bit of discomfort in the recovery area but after passage of gas and movement she was able to feel much better.  Her discomfort at the end of the procedure was more consistent with her "abdominal migraines" that she is experienced previously.  She knows that if things were to have progressed overnight she should have called and gone to the emergency department so hopefully as you call tomorrow morning she will be feeling better and had not had to go to the emergency department. Thanks. GM

## 2022-07-28 ENCOUNTER — Encounter: Payer: Self-pay | Admitting: Gastroenterology

## 2022-07-31 ENCOUNTER — Telehealth: Payer: Self-pay | Admitting: Pharmacy Technician

## 2022-07-31 NOTE — Telephone Encounter (Signed)
Patient Advocate Encounter  Received notification from [place] that prior authorization for OMEPRAZOLE '40MG'$  is required.   PA submitted on 8.14.23 Key [id number] Status is pending    Luciano Cutter, CPhT Patient Advocate Phone: [phone]

## 2022-08-01 NOTE — Telephone Encounter (Signed)
Patient Advocate Encounter  Prior Authorization for Omeprazole '40MG'$  has been approved.   Effective: 08/01/2022 to 10/01/2022  Clista Bernhardt, CPhT Rx Patient Advocate Specialist Phone: [phone]

## 2022-08-02 ENCOUNTER — Other Ambulatory Visit: Payer: Self-pay | Admitting: Physician Assistant

## 2022-09-12 ENCOUNTER — Ambulatory Visit: Payer: Commercial Managed Care - HMO | Admitting: Gastroenterology

## 2022-09-26 ENCOUNTER — Ambulatory Visit: Payer: Commercial Managed Care - HMO | Admitting: Gastroenterology

## 2022-10-03 ENCOUNTER — Other Ambulatory Visit: Payer: Self-pay | Admitting: Gastroenterology

## 2022-10-03 DIAGNOSIS — K259 Gastric ulcer, unspecified as acute or chronic, without hemorrhage or perforation: Secondary | ICD-10-CM

## 2022-10-26 ENCOUNTER — Encounter: Payer: 59 | Admitting: Physician Assistant

## 2022-11-05 ENCOUNTER — Other Ambulatory Visit: Payer: Self-pay | Admitting: Medical

## 2022-11-14 ENCOUNTER — Ambulatory Visit (INDEPENDENT_AMBULATORY_CARE_PROVIDER_SITE_OTHER): Payer: Commercial Managed Care - HMO | Admitting: Nurse Practitioner

## 2022-11-14 ENCOUNTER — Encounter: Payer: Self-pay | Admitting: Nurse Practitioner

## 2022-11-14 VITALS — BP 118/78 | HR 89 | Temp 98.6°F | Wt 145.4 lb

## 2022-11-14 DIAGNOSIS — G43D0 Abdominal migraine, not intractable: Secondary | ICD-10-CM

## 2022-11-14 DIAGNOSIS — Z23 Encounter for immunization: Secondary | ICD-10-CM | POA: Diagnosis not present

## 2022-11-14 DIAGNOSIS — N959 Unspecified menopausal and perimenopausal disorder: Secondary | ICD-10-CM

## 2022-11-14 MED ORDER — AMITRIPTYLINE HCL 50 MG PO TABS: ORAL_TABLET | ORAL | 3 refills | Status: DC

## 2022-11-14 MED ORDER — CLONIDINE HCL 0.1 MG PO TABS: 0.1000 mg | ORAL_TABLET | Freq: Every evening | ORAL | 3 refills | Status: DC | PRN

## 2022-11-14 MED ORDER — CLONIDINE HCL 0.1 MG PO TABS: 0.0500 mg | ORAL_TABLET | Freq: Every evening | ORAL | 3 refills | Status: DC | PRN

## 2022-11-14 NOTE — Patient Instructions (Addendum)
I have sent in a medication called clonidine that you can try for hot flashes. If you are noticing multiple hot flashes you can take this (1/2 to 1 tab) daily to see if this helps. Start with a 1/2 tab and increase if not effective. Let me know if this does not work.   I have sent in refills of your amitriptyline for a year- if you need more ondansetron, please do not hesitate to reach out to me.

## 2022-11-14 NOTE — Assessment & Plan Note (Signed)
Chronic.  Currently well controlled with daily amitriptyline for prophylaxis.  At this time she has no alarm symptoms present.  She does not need refills on her Zofran today, but we did discuss if she finds the need for these in the future she can reach out to me for refills.  She does need refills on her amitriptyline at this time.  We briefly discussed the option of triptan medication however given her good control at this time we will keep that as an option that may need to be considered in the future if symptoms change.  Plan to follow-up in 1 year as long as symptoms are stable.

## 2022-11-14 NOTE — Assessment & Plan Note (Signed)
Vasomotor symptoms related to menopause reported patient.  We did discuss medication options today however given her history of abdominal migraines that were previously triggered with menstruation I would like to avoid hormonal options for her as these could increase the risk of recurrence of worsening migraine symptoms.  We did discuss the option of clonidine 1/2-1 tab to be taken when she feels these symptoms are occurring on a daily basis.  She does like the idea of taking medication as needed.  Recommend monitoring for side effects such as dizziness and low blood pressure.  If symptoms do not improve with treatment recommend follow-up.

## 2022-11-14 NOTE — Addendum Note (Signed)
Addended by: Hassell Patras, Clarise Cruz E on: 11/14/2022 01:31 PM   Modules accepted: Orders

## 2022-11-14 NOTE — Progress Notes (Signed)
Worthy Keeler, DNP, AGNP-c [facility] [place], [place] [address] [phone] Office [phone] Fax  ESTABLISHED PATIENT- Chronic Health and/or Follow-Up Visit  Blood pressure 118/78, pulse 89, temperature 98.6 F (37 C), weight 145 lb 6.4 oz (66 kg).    Laura Guzman is a 56 y.o. year old female presenting today for evaluation and management of the following: other (Med check no other issues )  Shingles completed in [place] #1, completed here #2- records needed  Abdominal Migraines Laura Guzman positive for abdominal migraine headaches which commonly present as severe nausea and vomiting.  She tells me that previously her symptoms were not well controlled however she has learned to control this with avoidance of missed meals and daily use of amitriptyline.  She tells me that her symptoms are rare at this time.  She is not having any side effects of the amitriptyline and is tolerating the medication well.  She has not missed medication possible.  Vasomotor Symptoms of menopause Laura Guzman endorses increased vasomotor symptoms associated with menopause over the next several years.  She reports that the symptoms appear to come in waves where she will asymptomatic for a few months and then have multiple episodes for a few weeks in a row.  She has not tried any medications for management of this in the past but is interested in this option.  She is on amitriptyline for abdominal migraine prevention.  She tells me that when she does have symptoms she will use Zofran which is significantly beneficial as a triptan for management.   All ROS negative with exception of what is listed above.   PHYSICAL EXAM Physical Exam Vitals and nursing note reviewed.  Constitutional:      General: She is not in acute distress.    Appearance: Normal appearance.  HENT:     Head: Normocephalic.  Eyes:     Extraocular Movements:  Extraocular movements intact.     Conjunctiva/sclera: Conjunctivae normal.     Pupils: Pupils are equal, round, and reactive to light.  Neck:     Vascular: No carotid bruit.  Cardiovascular:     Rate and Rhythm: Normal rate and regular rhythm.     Pulses: Normal pulses.     Heart sounds: Normal heart sounds. No murmur heard. Pulmonary:     Effort: Pulmonary effort is normal.     Breath sounds: Normal breath sounds. No wheezing.  Abdominal:     General: Bowel sounds are normal. There is no distension.     Palpations: Abdomen is soft.     Tenderness: There is no abdominal tenderness. There is no guarding.  Musculoskeletal:        General: Normal range of motion.     Cervical back: Normal range of motion and neck supple.     Right lower leg: No edema.     Left lower leg: No edema.  Lymphadenopathy:     Cervical: No cervical adenopathy.  Skin:    General: Skin is warm and dry.     Capillary Refill: Capillary refill takes less than 2 seconds.  Neurological:     General: No focal deficit present.     Mental Status: She is alert and oriented to person, place, and time.  Psychiatric:        Mood and Affect: Mood normal.        Behavior: Behavior normal.        Thought Content: Thought content normal.  Judgment: Judgment normal.     PLAN Problem List Items Addressed This Visit     Abdominal migraine, not intractable - Primary    Chronic.  Currently well controlled with daily amitriptyline for prophylaxis.  At this time she has no alarm symptoms present.  She does not need refills on her Zofran today, but we did discuss if she finds the need for these in the future she can reach out to me for refills.  She does need refills on her amitriptyline at this time.  We briefly discussed the option of triptan medication however given her good control at this time we will keep that as an option that may need to be considered in the future if symptoms change.  Plan to follow-up in 1 year  as long as symptoms are stable.      Relevant Medications   cloNIDine (CATAPRES) 0.1 MG tablet   amitriptyline (ELAVIL) 50 MG tablet   Menopausal and perimenopausal disorder    Vasomotor symptoms related to menopause reported patient.  We did discuss medication options today however given her Guzman of abdominal migraines that were previously triggered with menstruation I would like to avoid hormonal options for her as these could increase the risk of recurrence of worsening migraine symptoms.  We did discuss the option of clonidine 1/2-1 tab to be taken when she feels these symptoms are occurring on a daily basis.  She does like the idea of taking medication as needed.  Recommend monitoring for side effects such as dizziness and low blood pressure.  If symptoms do not improve with treatment recommend follow-up.      Relevant Medications   cloNIDine (CATAPRES) 0.1 MG tablet   Other Visit Diagnoses     Need for influenza vaccination       Relevant Orders   Flu Vaccine QUAD 6+ mos PF IM (Fluarix Quad PF) (Completed)   Need for COVID-19 vaccine       Relevant Orders   Pfizer Fall 2023 Covid-19 Vaccine 60yr and older (Completed)       Return in about 1 year (around 11/15/2023) for Med CHeck Migraines.   SWorthy Keeler DNP, AGNP-c 11/14/2022  9:43 AM

## 2023-03-23 ENCOUNTER — Encounter: Payer: Self-pay | Admitting: Gastroenterology

## 2023-05-15 ENCOUNTER — Ambulatory Visit (INDEPENDENT_AMBULATORY_CARE_PROVIDER_SITE_OTHER): Payer: 59 | Admitting: Nurse Practitioner

## 2023-05-15 ENCOUNTER — Encounter: Payer: Self-pay | Admitting: Nurse Practitioner

## 2023-05-15 ENCOUNTER — Telehealth: Payer: Self-pay

## 2023-05-15 VITALS — BP 136/88 | HR 101 | Ht 64.5 in | Wt 136.0 lb

## 2023-05-15 DIAGNOSIS — Z01419 Encounter for gynecological examination (general) (routine) without abnormal findings: Secondary | ICD-10-CM | POA: Diagnosis not present

## 2023-05-15 DIAGNOSIS — N6322 Unspecified lump in the left breast, upper inner quadrant: Secondary | ICD-10-CM | POA: Diagnosis not present

## 2023-05-15 DIAGNOSIS — Z780 Asymptomatic menopausal state: Secondary | ICD-10-CM | POA: Diagnosis not present

## 2023-05-15 NOTE — Progress Notes (Signed)
   Laura Guzman Jul 16, 1966 [id number]   History:  57 y.o. [id number] presents for annual exam. Postmenopausal - no HRT, no bleeding. Normal pap history. Did not have diagnostic mammogram last year as recommended. Mass still present, unchanged. Last mammogram in 2018 per patient.   Gynecologic History No LMP recorded. Patient has had an ablation.   Contraception/Family planning: post menopausal status Sexually active: Yes  Health Maintenance Last Pap: 05/10/2022. Results were: Normal neg HPV, 5-year repeat Last mammogram: 2018. Results were: Normal Last colonoscopy: 07/25/2022. Results were: SSP, tubular adenoma, 1-year recall due to poor prep Last Dexa: Not indicated  Past medical history, past surgical history, family history and social history were all reviewed and documented in the EPIC chart. Moved back from [place] to care of dad who had MI. 2 older children in [place], 31 yo granddaughter.   ROS:  A ROS was performed and pertinent positives and negatives are included.  Exam:  Vitals:   05/15/23 0757  BP: 136/88  Pulse: (!) 101  SpO2: 100%  Weight: 136 lb (61.7 kg)  Height: 5' 4.5" (1.638 m)    Body mass index is 22.98 kg/m.  General appearance:  Normal Thyroid:  Symmetrical, normal in size, without palpable masses or nodularity. Respiratory  Auscultation:  Clear without wheezing or rhonchi Cardiovascular  Auscultation:  Regular rate, without rubs, murmurs or gallops  Edema/varicosities:  Not grossly evident Abdominal  Soft,nontender, without masses, guarding or rebound.  Liver/spleen:  No organomegaly noted  Hernia:  None appreciated  Skin  Inspection:  Grossly normal Breasts: Examined lying and sitting.   Right: Without masses, retractions, nipple discharge or axillary adenopathy.   Left: 2 cm mobile mass @ 11 o'clock position about 3 cm from nipple (unchanged from last year). Without retractions, nipple discharge or axillary adenopathy. Genitourinary    Inguinal/mons:  Normal without inguinal adenopathy  External genitalia:  Normal appearing vulva with no masses, tenderness, or lesions  BUS/Urethra/Skene's glands:  Normal  Vagina:  Normal appearing with normal color and discharge, no lesions  Cervix:  Normal appearing without discharge or lesions  Uterus:  Normal in size, shape and contour.  Midline and mobile, nontender  Adnexa/parametria:     Rt: Normal in size, without masses or tenderness.   Lt: Normal in size, without masses or tenderness.  Anus and perineum: Normal, non-bleeding hemorrhoids  Digital rectal exam: Deferred  Patient informed chaperone available to be present for breast and pelvic exam. Patient has requested no chaperone to be present. Patient has been advised what will be completed during breast and pelvic exam.   Assessment/Plan:  57 y.o. [id number] to establish care.   Well female exam with routine gynecological exam - Education provided on SBEs, importance of preventative screenings, current guidelines, high calcium diet, regular exercise, and multivitamin daily. Labs with PCP.   Postmenopausal - no HRT, no bleeding  Breast lump on left side at 11 o'clock position - 2 cm mobile mass @ 11 o'clock position about 3 cm from nipple. Unchanged from last year. Recommend diagnostic mammogram. Had issues getting records to imaging center last year. Will resend orders.   Screening for cervical cancer - Normal Pap history. Will repeat at 5-year interval per guidelines.   Screening for colon cancer - 07/2022 colonoscopy. 1-year recall.   Return in 1 year for annual.

## 2023-05-15 NOTE — Telephone Encounter (Signed)
-----   Message from Olivia Mackie, NP sent at 05/15/2023  8:20 AM EDT ----- Regarding: Breast imaging Please send referral for diagnostic imaging for left breast mass.

## 2023-05-15 NOTE — Telephone Encounter (Signed)
Mychart msg sent advising pt to contact [facility] at her earliest convenience to schedule dx breast imaging appt.

## 2023-06-18 NOTE — Telephone Encounter (Signed)
Per appt notes: "06/14/23- 1st attempt no vm-mg"  Pt has read mychart msg sent on 05/15/2023 w/ directions to contact them to make appt.   No appt made as of today.   Please advise on how you would like me to proceed with f/u. Thanks.

## 2023-07-03 NOTE — Telephone Encounter (Signed)
Called pt's #, no answer and unable to LVM.  Called pt's daughter per DPR and inquired if she could have her mom give [place] a call at her earliest convenience. She voiced understanding and was agreeable.

## 2023-07-03 NOTE — Telephone Encounter (Signed)
Spoke w/ pt and inquired as to what her plans were for the breast imaging recommendation per TW. Pt states she would like to still schedule.  Pt provided w/ # to [facility] and states she will call to schedule appt.

## 2023-07-13 ENCOUNTER — Other Ambulatory Visit: Payer: Commercial Managed Care - HMO

## 2023-07-13 NOTE — Telephone Encounter (Signed)
Pt scheduled for 07/23/2023 w/ route to provider for final review and close.

## 2023-07-23 ENCOUNTER — Ambulatory Visit
Admission: RE | Admit: 2023-07-23 | Discharge: 2023-07-23 | Disposition: A | Discharge status: Home / Self Care | Payer: 59 | Source: Ambulatory Visit | Attending: Nurse Practitioner | Admitting: Nurse Practitioner

## 2023-07-23 ENCOUNTER — Ambulatory Visit
Admission: RE | Admit: 2023-07-23 | Discharge: 2023-07-23 | Disposition: A | Discharge status: Home / Self Care | Payer: Commercial Managed Care - HMO | Source: Ambulatory Visit | Attending: Nurse Practitioner | Admitting: Nurse Practitioner

## 2023-07-23 DIAGNOSIS — N6322 Unspecified lump in the left breast, upper inner quadrant: Secondary | ICD-10-CM | POA: Diagnosis not present

## 2023-12-23 ENCOUNTER — Other Ambulatory Visit: Payer: Self-pay | Admitting: Nurse Practitioner

## 2023-12-23 DIAGNOSIS — G43D0 Abdominal migraine, not intractable: Secondary | ICD-10-CM

## 2023-12-24 NOTE — Telephone Encounter (Signed)
 Pt called and scheduled a med check for 01/08/24 and is asking for a refill until then on her  Amitriptyline HCl

## 2024-01-08 ENCOUNTER — Ambulatory Visit (INDEPENDENT_AMBULATORY_CARE_PROVIDER_SITE_OTHER): Payer: 59 | Admitting: Nurse Practitioner

## 2024-01-08 ENCOUNTER — Telehealth: Payer: Self-pay

## 2024-01-08 ENCOUNTER — Encounter: Payer: Self-pay | Admitting: Nurse Practitioner

## 2024-01-08 VITALS — BP 128/82 | HR 84 | Wt 135.8 lb

## 2024-01-08 DIAGNOSIS — G43D0 Abdominal migraine, not intractable: Secondary | ICD-10-CM | POA: Diagnosis not present

## 2024-01-08 DIAGNOSIS — K649 Unspecified hemorrhoids: Secondary | ICD-10-CM

## 2024-01-08 DIAGNOSIS — Z23 Encounter for immunization: Secondary | ICD-10-CM

## 2024-01-08 DIAGNOSIS — Z139 Encounter for screening, unspecified: Secondary | ICD-10-CM

## 2024-01-08 DIAGNOSIS — N959 Unspecified menopausal and perimenopausal disorder: Secondary | ICD-10-CM

## 2024-01-08 MED ORDER — AMITRIPTYLINE HCL 50 MG PO TABS: 50.0000 mg | ORAL_TABLET | Freq: Every day | ORAL | 3 refills | Status: AC

## 2024-01-08 NOTE — Telephone Encounter (Signed)
Pt states [facility] wants her to come back soon for labs but I didn't see any order. Pt does not know what she wanted to be drawn. Please advise.

## 2024-01-08 NOTE — Patient Instructions (Addendum)
I recommend trying Chia seeds to help with your fiber intake. This can help make your bowel movements easier, also.   Witch hazel can help sooth the hemorrhoid and help reduce the chance of bleeding. Try hydrocortisone cream over the counter on the area to help with swelling and shrink the hemorrhoid.  Check with your insurance about the colonoscopy and coverage for this. If the coverage is not there, we can send you to general surgery to see about hemorrhoid removal. This can be done in the office in most cases and can really help prevent the hemorrhoids from bleeding.   Let me know where you would like the referral to go and I will be happy to send that in.    Nonsurgical Procedures for Hemorrhoids, Care After After the procedure for hemorrhoids, it is common to have: A little bit of bleeding from the rectum for a few days. Soreness or a dull ache near the rectum. Follow these instructions at home: Medicines Take over-the-counter and prescription medicines only as told by your health care provider. Use a stool softener or a medicine that helps you poop (laxative) as told by your provider. Eating and drinking Eat foods that have a lot of fiber in them. These include whole grains, beans, nuts, fruits, and vegetables. Drink enough fluid to keep your pee (urine) pale yellow. Managing pain and swelling  Take warm sitz baths for 20 minutes, 3-4 times a day to ease pain and discomfort. You may do this in a bathtub or using a portable sitz bath that fits over the toilet. If told, put ice on the affected area. It may help to use ice packs between sitz baths. Put ice in a plastic bag. Place a towel between your skin and the bag. Leave the ice on for 20 minutes, 2-3 times a day. If your skin turns bright red, remove the ice right away to prevent skin damage. The risk of damage is higher if you cannot feel pain, heat, or cold. Activity You may have to avoid lifting. Ask your provider how much you  can safely lift. Do not sit for a long time without moving. Take a walk every day or as told by your provider. Return to your normal activities as told by your provider. Ask your provider what activities are safe for you. General instructions Do not strain to poop. Do not spend a long time sitting on the toilet. Your provider may give you more instructions. Make sure you know what you can and cannot do. Contact a health care provider if: Your pain medicine is not helping. You have a fever. You become constipated. You have light bleeding from your rectum for more than a few days. You cannot pee (urinate). You have very bad pain in your rectum. Get help right away if: You are bleeding a lot from your rectum. This information is not intended to replace advice given to you by your health care provider. Make sure you discuss any questions you have with your health care provider. Document Revised: 07/28/2022 Document Reviewed: 07/28/2022 Elsevier Patient Education  2024 [employer].

## 2024-01-08 NOTE — Progress Notes (Signed)
Shawna Clamp, DNP, AGNP-c [facility]  [address] [place], [place] [address] [phone]  ESTABLISHED PATIENT- Chronic Health and/or Follow-Up Visit  Blood pressure 128/82, pulse 84, weight 135 lb 12.8 oz (61.6 kg).    Laura Guzman is a 58 y.o. year old female presenting today for evaluation and management of chronic conditions.   [name] presents with a longstanding history of hemorrhoids, which have progressively worsened over time. Initially, the hemorrhoids were asymptomatic, but with advancing age, they have become a significant concern. The patient reports frequent bleeding associated with bowel movements, to the extent of requiring a pad due to the amount of blood loss. The bleeding occurs irrespective of straining during defecation. The patient also experiences discomfort when the hemorrhoids prolapse, managing this with the application of Vaseline to ease her back in.  In addition to the hemorrhoids, the patient has a history of migraines, which have been well-managed with nightly amitriptyline. The patient reports no recent episodes of migraines since starting the medication, which has significantly improved her quality of life, enabling her to work again.  The patient also has a history of gastrointestinal issues, for which she was previously prescribed omeprazole. Currently, the patient takes the medication as needed and reports having a sufficient supply.  In 2022, the patient underwent a colonoscopy and reports the provider suggested a follow-up procedure. However, due to insurance issues, specifically non-coverage of anesthesia, the patient has not scheduled another colonoscopy. The patient plans to contact her current insurance provider, Monia Pouch, to understand the coverage details before scheduling another procedure.  The patient denies any recent chest pain, shortness of breath, dizziness, weakness, or excessive fatigue. She reports satisfaction with  her current part-time job, which provides social interaction and a break from her responsibilities at home, where she cares for her father.  All ROS negative with exception of what is listed above.   PHYSICAL EXAM Physical Exam Vitals and nursing note reviewed.  Constitutional:      Appearance: Normal appearance.  HENT:     Head: Normocephalic.  Eyes:     Pupils: Pupils are equal, round, and reactive to light.  Cardiovascular:     Rate and Rhythm: Normal rate and regular rhythm.     Pulses: Normal pulses.     Heart sounds: Normal heart sounds.  Pulmonary:     Effort: Pulmonary effort is normal.     Breath sounds: Normal breath sounds.  Musculoskeletal:        General: Normal range of motion.     Cervical back: Normal range of motion.  Skin:    General: Skin is warm.  Neurological:     General: No focal deficit present.     Mental Status: She is alert and oriented to person, place, and time.  Psychiatric:        Mood and Affect: Mood normal.      PLAN Problem List Items Addressed This Visit     Abdominal migraine, not intractable   Well-managed on amitriptyline. - Continue current amitriptyline regimen.      Relevant Medications   amitriptyline (ELAVIL) 50 MG tablet   Hemorrhoids   Chronic hemorrhoids with persistent bleeding and significant discomfort. Previous colonoscopy indicated a tortuous intestine; follow-up delayed due to insurance issues with anesthesia coverage. Discussed benefits of fiber supplements, chia seeds, witch hazel, sitz baths, and hydrocortisone cream. General surgery for hemorrhoid removal is an alternative if insurance does not cover colonoscopy. - Check insurance coverage for colonoscopy and anesthesia. - Consider  general surgery consultation for hemorrhoid removal if insurance does not cover colonoscopy. - Start fiber supplements gradually to avoid bloating. - Use chia seeds to aid bowel movements. - Apply witch hazel for soothing. - Take  sitz baths with warm water and Epsom salts. - Use hydrocortisone cream (2.5%) to reduce inflammation.      Other Visit Diagnoses       Need for influenza vaccination    -  Primary   Relevant Orders   Flu vaccine trivalent PF, 6mos and older(Flulaval,Afluria,Fluarix,Fluzone) (Completed)     Need for COVID-19 vaccine       Relevant Orders   Pfizer Comirnaty Covid -19 Vaccine 2yrs and older (Completed)       Return for cpe when convenient.  Shawna Clamp, DNP, AGNP-c

## 2024-01-16 DIAGNOSIS — K649 Unspecified hemorrhoids: Secondary | ICD-10-CM | POA: Insufficient documentation

## 2024-01-16 NOTE — Assessment & Plan Note (Signed)
Well-managed on amitriptyline. - Continue current amitriptyline regimen.

## 2024-01-16 NOTE — Assessment & Plan Note (Signed)
Chronic hemorrhoids with persistent bleeding and significant discomfort. Previous colonoscopy indicated a tortuous intestine; follow-up delayed due to insurance issues with anesthesia coverage. Discussed benefits of fiber supplements, chia seeds, witch hazel, sitz baths, and hydrocortisone cream. General surgery for hemorrhoid removal is an alternative if insurance does not cover colonoscopy. - Check insurance coverage for colonoscopy and anesthesia. - Consider general surgery consultation for hemorrhoid removal if insurance does not cover colonoscopy. - Start fiber supplements gradually to avoid bloating. - Use chia seeds to aid bowel movements. - Apply witch hazel for soothing. - Take sitz baths with warm water and Epsom salts. - Use hydrocortisone cream (2.5%) to reduce inflammation.

## 2024-01-18 NOTE — Telephone Encounter (Signed)
 Called pt to schedule, no answer, unable to leave vm

## 2024-01-21 NOTE — Telephone Encounter (Signed)
 Pt scheduled for 01/25/24

## 2024-01-25 ENCOUNTER — Other Ambulatory Visit: Payer: 59

## 2024-01-25 DIAGNOSIS — K649 Unspecified hemorrhoids: Secondary | ICD-10-CM

## 2024-01-25 DIAGNOSIS — N959 Unspecified menopausal and perimenopausal disorder: Secondary | ICD-10-CM | POA: Diagnosis not present

## 2024-01-25 DIAGNOSIS — D649 Anemia, unspecified: Secondary | ICD-10-CM | POA: Diagnosis not present

## 2024-01-25 DIAGNOSIS — Z139 Encounter for screening, unspecified: Secondary | ICD-10-CM | POA: Diagnosis not present

## 2024-01-25 DIAGNOSIS — R7303 Prediabetes: Secondary | ICD-10-CM | POA: Diagnosis not present

## 2024-01-25 LAB — LIPID PANEL

## 2024-01-26 LAB — COMPREHENSIVE METABOLIC PANEL
ALT: 20 IU/L (ref 0–32)
AST: 15 IU/L (ref 0–40)
Albumin: 4 g/dL (ref 3.8–4.9)
Alkaline Phosphatase: 107 IU/L (ref 44–121)
BUN/Creatinine Ratio: 13 (ref 9–23)
BUN: 11 mg/dL (ref 6–24)
CO2: 23 mmol/L (ref 20–29)
Calcium: 8.4 mg/dL — ABNORMAL LOW (ref 8.7–10.2)
Chloride: 106 mmol/L (ref 96–106)
Creatinine, Ser: 0.82 mg/dL (ref 0.57–1.00)
Globulin, Total: 2.1 g/dL (ref 1.5–4.5)
Glucose: 82 mg/dL (ref 70–99)
Potassium: 4.2 mmol/L (ref 3.5–5.2)
Sodium: 142 mmol/L (ref 134–144)
Total Protein: 6.1 g/dL (ref 6.0–8.5)
eGFR: 84 mL/min/{1.73_m2} (ref >59)

## 2024-01-26 LAB — CBC WITH DIFFERENTIAL/PLATELET
Basophils Absolute: 0 10*3/uL (ref 0.0–0.2)
Basos: 1 %
EOS (ABSOLUTE): 0.2 10*3/uL (ref 0.0–0.4)
Eos: 3 %
Hematocrit: 36.2 % (ref 34.0–46.6)
Hemoglobin: 10.9 g/dL — ABNORMAL LOW (ref 11.1–15.9)
Immature Grans (Abs): 0 10*3/uL (ref 0.0–0.1)
Immature Granulocytes: 0 %
Lymphocytes Absolute: 2.1 10*3/uL (ref 0.7–3.1)
Lymphs: 41 %
MCH: 22 pg — ABNORMAL LOW (ref 26.6–33.0)
MCHC: 30.1 g/dL — ABNORMAL LOW (ref 31.5–35.7)
MCV: 73 fL — ABNORMAL LOW (ref 79–97)
Monocytes Absolute: 0.4 10*3/uL (ref 0.1–0.9)
Monocytes: 8 %
Neutrophils Absolute: 2.4 10*3/uL (ref 1.4–7.0)
Neutrophils: 47 %
Platelets: 334 10*3/uL (ref 150–450)
RBC: 4.95 x10E6/uL (ref 3.77–5.28)
RDW: 16.8 % — ABNORMAL HIGH (ref 11.7–15.4)
WBC: 5.2 10*3/uL (ref 3.4–10.8)

## 2024-01-26 LAB — HEMOGLOBIN A1C
Est. average glucose Bld gHb Est-mCnc: 120 mg/dL
Hgb A1c MFr Bld: 5.8 % — ABNORMAL HIGH (ref 4.8–5.6)

## 2024-01-26 LAB — IRON,TIBC AND FERRITIN PANEL
Ferritin: 18 ng/mL (ref 15–150)
Iron Saturation: 36 % (ref 15–55)
Iron: 126 ug/dL (ref 27–159)
Total Iron Binding Capacity: 346 ug/dL (ref 250–450)
UIBC: 220 ug/dL (ref 131–425)

## 2024-01-26 LAB — LIPID PANEL
Cholesterol, Total: 240 mg/dL — ABNORMAL HIGH (ref 100–199)
HDL: 91 mg/dL (ref >39)
LDL CALC COMMENT:: 2.6 ratio (ref 0.0–4.4)
LDL Chol Calc (NIH): 135 mg/dL — ABNORMAL HIGH (ref 0–99)
Triglycerides: 82 mg/dL (ref 0–149)
VLDL Cholesterol Cal: 14 mg/dL (ref 5–40)

## 2024-01-26 LAB — VITAMIN D 25 HYDROXY (VIT D DEFICIENCY, FRACTURES): Vit D, 25-Hydroxy: 7.8 ng/mL — ABNORMAL LOW (ref 30.0–100.0)

## 2024-01-30 ENCOUNTER — Other Ambulatory Visit: Payer: Self-pay | Admitting: Nurse Practitioner

## 2024-01-30 ENCOUNTER — Encounter: Payer: Self-pay | Admitting: Nurse Practitioner

## 2024-01-30 DIAGNOSIS — E559 Vitamin D deficiency, unspecified: Secondary | ICD-10-CM

## 2024-01-30 DIAGNOSIS — R7303 Prediabetes: Secondary | ICD-10-CM

## 2024-01-30 MED ORDER — VITAMIN D (ERGOCALCIFEROL) 1.25 MG (50000 UNIT) PO CAPS: 50000.0000 [IU] | ORAL_CAPSULE | ORAL | 3 refills | Status: AC

## 2024-01-31 ENCOUNTER — Encounter: Payer: Self-pay | Admitting: Nurse Practitioner

## 2024-02-10 LAB — SPECIMEN STATUS REPORT

## 2024-02-10 LAB — INTERPRETATION

## 2024-02-10 LAB — METHYLMALONIC ACID, SERUM: Methylmalonic Acid, Serum: 172 nmol/L (ref 0–378)

## 2024-02-10 LAB — VITAMIN B12 DEFICIENCY CASCADE: Vitamin B-12: 370 pg/mL (ref 232–1245)

## 2024-02-22 NOTE — Progress Notes (Deleted)
 Shingrix/covid: Last colonoscopy:

## 2024-02-26 ENCOUNTER — Encounter: Payer: 59 | Admitting: Nurse Practitioner

## 2024-06-03 ENCOUNTER — Ambulatory Visit (INDEPENDENT_AMBULATORY_CARE_PROVIDER_SITE_OTHER): Admitting: Nurse Practitioner

## 2024-06-03 ENCOUNTER — Encounter: Payer: Self-pay | Admitting: Nurse Practitioner

## 2024-06-03 VITALS — BP 124/82 | HR 85 | Ht 64.75 in | Wt 141.8 lb

## 2024-06-03 DIAGNOSIS — N959 Unspecified menopausal and perimenopausal disorder: Secondary | ICD-10-CM | POA: Diagnosis not present

## 2024-06-03 DIAGNOSIS — R7303 Prediabetes: Secondary | ICD-10-CM

## 2024-06-03 DIAGNOSIS — E559 Vitamin D deficiency, unspecified: Secondary | ICD-10-CM | POA: Diagnosis not present

## 2024-06-03 DIAGNOSIS — Z23 Encounter for immunization: Secondary | ICD-10-CM | POA: Diagnosis not present

## 2024-06-03 DIAGNOSIS — Z0000 Encounter for general adult medical examination without abnormal findings: Secondary | ICD-10-CM

## 2024-06-03 DIAGNOSIS — Z6823 Body mass index (BMI) 23.0-23.9, adult: Secondary | ICD-10-CM | POA: Diagnosis not present

## 2024-06-03 NOTE — Patient Instructions (Addendum)
 You look fabulous!! I will let you know what your labs show once these come back.   Keep up roller skating!! That is amazing.   I hope you have a wonderful and safe summer and enjoy your trips.

## 2024-06-03 NOTE — Progress Notes (Unsigned)
 Dell Fennel, DNP, AGNP-c [facility] [address] [place], [place] [address] Main Office [phone]  There were no vitals taken for this visit.   Subjective:    Patient ID: Laura Guzman, female    DOB: [birth year], 58 y.o.   MRN: [id number]  HPI: Laura Guzman is a 58 y.o. female presenting on 06/03/2024 for comprehensive medical examination.  Add B12 500mcg daily.   .hpisecconsentabridge .apsecabridge .pesec    Pertinent items are noted in HPI.   Most Recent Depression Screen:     06/03/2024    8:58 AM 01/08/2024   10:28 AM 11/14/2022    9:47 AM 04/25/2022    8:59 AM 09/29/2015   10:24 AM  Depression screen PHQ 2/9  Decreased Interest 0 0 0 0 0  Down, Depressed, Hopeless 0 0 0 0 0  PHQ - 2 Score 0 0 0 0 0   Most Recent Anxiety Screen:      No data to display         Most Recent Fall Screen:    06/03/2024    8:58 AM 01/08/2024   10:27 AM 11/14/2022    9:47 AM 04/25/2022    8:59 AM 09/29/2015   10:24 AM  Fall Risk   Falls in the past year? 0 0 0 0 No   Number falls in past yr: 0 0 0 0   Injury with Fall? 0 0 0 0   Risk for fall due to : No Fall Risks No Fall Risks No Fall Risks History of fall(s)   Follow up Falls evaluation completed Falls evaluation completed Falls evaluation completed  Falls evaluation completed       Data saved with a previous flowsheet row definition    Past medical history, surgical history, medications, allergies, family history and social history reviewed with patient today and changes made to appropriate areas of the chart.  Past Medical History:  Past Medical History:  Diagnosis Date  . Abnormal uterine bleeding (AUB) 04/13/2015  . Dysfunctional uterine bleeding   . Hyperemesis    This is called cyclic vomitting syndrome  . Migraines    Abdominal migraine   Medications:  Current Outpatient Medications on File Prior to Visit  Medication Sig  . amitriptyline  (ELAVIL ) 50 MG tablet Take 1 tablet  (50 mg total) by mouth at bedtime.  . Multiple Vitamin (MULTIVITAMIN) tablet Take 1 tablet by mouth daily.  . omeprazole  (PRILOSEC) 40 MG capsule Take 1 capsule (40 mg total) by mouth daily. TAKE 1 CAPSULE BY MOUTH TWICE DAILY FOR 2 MONTHS THEN ONCE DAILY THEREAFTER  . Vitamin D , Ergocalciferol , (DRISDOL ) 1.25 MG (50000 UNIT) CAPS capsule Take 1 capsule (50,000 Units total) by mouth every 7 (seven) days.   No current facility-administered medications on file prior to visit.   Surgical History:  Past Surgical History:  Procedure Laterality Date  . ENDOMETRIAL ABLATION    . HYSTEROSCOPY N/A 11/30/2015   Procedure: HYSTEROSCOPY WITH HYDROTHERMAL ABLATION;  Surgeon: Verlyn Goad, MD;  Location: [facility];  Service: Gynecology;  Laterality: N/A;  . NO PAST SURGERIES     Allergies:  Allergies  Allergen Reactions  . Compazine [Prochlorperazine Edisylate] Swelling    States jaw locked down   Family History:  Family History  Problem Relation Age of Onset  . Hypertension Mother   . Hyperlipidemia Mother   . Kidney disease Mother   . Heart attack Father   . Hypertension Father   . Kidney disease Father   .  Colon cancer Neg Hx   . CVA Neg Hx   . Prostate cancer Neg Hx   . Breast cancer Neg Hx   . Diabetes type II Neg Hx   . Rectal cancer Neg Hx   . Stomach cancer Neg Hx   . Liver cancer Neg Hx   . Pancreatic cancer Neg Hx        Objective:    There were no vitals taken for this visit.  Wt Readings from Last 3 Encounters:  01/08/24 135 lb 12.8 oz (61.6 kg)  05/15/23 136 lb (61.7 kg)  11/14/22 145 lb 6.4 oz (66 kg)    Physical Exam   Results for orders placed or performed in visit on 01/25/24  VITAMIN D  25 Hydroxy (Vit-D Deficiency, Fractures)   Collection Time: 01/25/24  8:06 AM  Result Value Ref Range   Vit D, 25-Hydroxy 7.8 (L) 30.0 - 100.0 ng/mL  Lipid panel   Collection Time: 01/25/24  8:06 AM  Result Value Ref Range   Cholesterol, Total 240 (H) 100 - 199 mg/dL    Triglycerides 82 0 - 149 mg/dL   HDL 91 >45 mg/dL   VLDL Cholesterol Cal 14 5 - 40 mg/dL   LDL Chol Calc (NIH) 409 (H) 0 - 99 mg/dL   Chol/HDL Ratio 2.6 0.0 - 4.4 ratio  Iron, TIBC and Ferritin Panel   Collection Time: 01/25/24  8:06 AM  Result Value Ref Range   Total Iron Binding Capacity 346 250 - 450 ug/dL   UIBC 811 914 - 782 ug/dL   Iron 956 27 - 213 ug/dL   Iron Saturation 36 15 - 55 %   Ferritin 18 15 - 150 ng/mL  Comprehensive metabolic panel   Collection Time: 01/25/24  8:06 AM  Result Value Ref Range   Glucose 82 70 - 99 mg/dL   BUN 11 6 - 24 mg/dL   Creatinine, Ser 0.86 0.57 - 1.00 mg/dL   eGFR 84 >57 QI/ONG/2.95   BUN/Creatinine Ratio 13 9 - 23   Sodium 142 134 - 144 mmol/L   Potassium 4.2 3.5 - 5.2 mmol/L   Chloride 106 96 - 106 mmol/L   CO2 23 20 - 29 mmol/L   Calcium 8.4 (L) 8.7 - 10.2 mg/dL   Total Protein 6.1 6.0 - 8.5 g/dL   Albumin 4.0 3.8 - 4.9 g/dL   Globulin, Total 2.1 1.5 - 4.5 g/dL   Bilirubin Total <2.8 0.0 - 1.2 mg/dL   Alkaline Phosphatase 107 44 - 121 IU/L   AST 15 0 - 40 IU/L   ALT 20 0 - 32 IU/L  CBC with Differential/Platelet   Collection Time: 01/25/24  8:06 AM  Result Value Ref Range   WBC 5.2 3.4 - 10.8 x10E3/uL   RBC 4.95 3.77 - 5.28 x10E6/uL   Hemoglobin 10.9 (L) 11.1 - 15.9 g/dL   Hematocrit 41.3 24.4 - 46.6 %   MCV 73 (L) 79 - 97 fL   MCH 22.0 (L) 26.6 - 33.0 pg   MCHC 30.1 (L) 31.5 - 35.7 g/dL   RDW 01.0 (H) 27.2 - 53.6 %   Platelets 334 150 - 450 x10E3/uL   Neutrophils 47 Not Estab. %   Lymphs 41 Not Estab. %   Monocytes 8 Not Estab. %   Eos 3 Not Estab. %   Basos 1 Not Estab. %   Neutrophils Absolute 2.4 1.4 - 7.0 x10E3/uL   Lymphocytes Absolute 2.1 0.7 - 3.1 x10E3/uL   Monocytes Absolute  0.4 0.1 - 0.9 x10E3/uL   EOS (ABSOLUTE) 0.2 0.0 - 0.4 x10E3/uL   Basophils Absolute 0.0 0.0 - 0.2 x10E3/uL   Immature Granulocytes 0 Not Estab. %   Immature Grans (Abs) 0.0 0.0 - 0.1 x10E3/uL  Hemoglobin A1c   Collection Time:  01/25/24  8:06 AM  Result Value Ref Range   Hgb A1c MFr Bld 5.8 (H) 4.8 - 5.6 %   Est. average glucose Bld gHb Est-mCnc 120 mg/dL  Vitamin B12 Deficiency Cascade   Collection Time: 01/25/24  8:06 AM  Result Value Ref Range   Vitamin B-12 370 232 - 1,245 pg/mL  Specimen status report   Collection Time: 01/25/24  8:06 AM  Result Value Ref Range   specimen status report Comment   Methylmalonic Acid, Serum   Collection Time: 01/25/24  8:06 AM  Result Value Ref Range   Methylmalonic Acid, Serum 172 0 - 378 nmol/L  Interpretation   Collection Time: 01/25/24  8:06 AM  Result Value Ref Range   Interpretation Comment        Assessment & Plan:   Problem List Items Addressed This Visit   None     Follow up plan: No follow-ups on file.  NEXT PREVENTATIVE PHYSICAL DUE IN 1 YEAR.  PATIENT COUNSELING PROVIDED FOR ALL ADULT PATIENTS: A well balanced diet low in saturated fats, cholesterol, and moderation in carbohydrates.  This can be as simple as monitoring portion sizes and cutting back on sugary beverages such as soda and juice to start with.    Daily water consumption of at least 64 ounces.  Physical activity at least 180 minutes per week.  If just starting out, start 10 minutes a day and work your way up.   This can be as simple as taking the stairs instead of the elevator and walking 2-3 laps around the office  purposefully every day.   STD protection, partner selection, and regular testing if high risk.  Limited consumption of alcoholic beverages if alcohol is consumed. For men, I recommend no more than 14 alcoholic beverages per week, spread out throughout the week (max 2 per day). Avoid binge drinking or consuming large quantities of alcohol in one setting.  Please let me know if you feel you may need help with reduction or quitting alcohol consumption.   Avoidance of nicotine, if used. Please let me know if you feel you may need help with reduction or quitting  nicotine use.   Daily mental health attention. This can be in the form of 5 minute daily meditation, prayer, journaling, yoga, reflection, etc.  Purposeful attention to your emotions and mental state can significantly improve your overall wellbeing  and  Health.  Please know that I am here to help you with all of your health care goals and am happy to work with you to find a solution that works best for you.  The greatest advice I have received with any changes in life are to take it one step at a time, that even means if all you can focus on is the next 60 seconds, then do that and celebrate your victories.  With any changes in life, you will have set backs, and that is OK. The important thing to remember is, if you have a set back, it is not a failure, it is an opportunity to try again! Screening Testing Mammogram Every 1 -2 years based on history and risk factors Starting at age 20 Pap Smear Ages 21-39 every 3 years  Ages 72-65 every 5 years with HPV testing More frequent testing may be required based on results and history Colon Cancer Screening Every 1-10 years based on test performed, risk factors, and history Starting at age 36 Bone Density Screening Every 2-10 years based on history Starting at age 3 for women Recommendations for men differ based on medication usage, history, and risk factors AAA Screening One time ultrasound Men 66-19 years old who have every smoked Lung Cancer Screening Low Dose Lung CT every 12 months Age 37-80 years with a 30 pack-year smoking history who still smoke or who have quit within the last 15 years   Screening Labs Routine  Labs: Complete Blood Count (CBC), Complete Metabolic Panel (CMP), Cholesterol (Lipid Panel) Every 6-12 months based on history and medications May be recommended more frequently based on current conditions or previous results Hemoglobin A1c Lab Every 3-12 months based on history and previous results Starting at age 21 or  earlier with diagnosis of diabetes, high cholesterol, BMI >26, and/or risk factors Frequent monitoring for patients with diabetes to ensure blood sugar control Thyroid Panel (TSH) Every 6 months based on history, symptoms, and risk factors May be repeated more often if on medication HIV One time testing for all patients 51 and older May be repeated more frequently for patients with increased risk factors or exposure Hepatitis C One time testing for all patients 58 and older May be repeated more frequently for patients with increased risk factors or exposure Gonorrhea, Chlamydia Every 12 months for all sexually active persons 13-24 years Additional monitoring may be recommended for those who are considered high risk or who have symptoms Every 12 months for any woman on birth control, regardless of sexual activity PSA Men 89-29 years old with risk factors Additional screening may be recommended from age 55-69 based on risk factors, symptoms, and history  Vaccine Recommendations Tetanus Booster All adults every 10 years Flu Vaccine All patients 6 months and older every year COVID Vaccine All patients 12 years and older Initial dosing with booster May recommend additional booster based on age and health history HPV Vaccine 2 doses all patients age 52-26 Dosing may be considered for patients over 26 Shingles Vaccine (Shingrix) 2 doses all adults 55 years and older Pneumonia (Pneumovax 3) All adults 65 years and older May recommend earlier dosing based on health history One year apart from Prevnar 14 Pneumonia (Prevnar 24) All adults 65 years and older Dosed 1 year after Pneumovax 23 Pneumonia (Prevnar 20) One time alternative to the two dosing of 13 and 23 For all adults with initial dose of 23, 20 is recommended 1 year later For all adults with initial dose of 13, 23 is still recommended as second option 1 year later

## 2024-06-04 LAB — CMP14+EGFR
ALT: 14 IU/L (ref 0–32)
AST: 17 IU/L (ref 0–40)
Albumin: 4.2 g/dL (ref 3.8–4.9)
Alkaline Phosphatase: 114 IU/L (ref 44–121)
BUN/Creatinine Ratio: 19 (ref 9–23)
BUN: 15 mg/dL (ref 6–24)
Bilirubin Total: <0.2 mg/dL (ref 0.0–1.2)
CO2: 22 mmol/L (ref 20–29)
Calcium: 8.9 mg/dL (ref 8.7–10.2)
Chloride: 105 mmol/L (ref 96–106)
Creatinine, Ser: 0.81 mg/dL (ref 0.57–1.00)
Globulin, Total: 2.5 g/dL (ref 1.5–4.5)
Glucose: 89 mg/dL (ref 70–99)
Potassium: 4.2 mmol/L (ref 3.5–5.2)
Sodium: 142 mmol/L (ref 134–144)
Total Protein: 6.7 g/dL (ref 6.0–8.5)
eGFR: 86 mL/min/{1.73_m2} (ref >59)

## 2024-06-04 LAB — LIPID PANEL
Chol/HDL Ratio: 2.8 ratio (ref 0.0–4.4)
Cholesterol, Total: 253 mg/dL — ABNORMAL HIGH (ref 100–199)
HDL: 91 mg/dL (ref >39)
LDL Chol Calc (NIH): 151 mg/dL — ABNORMAL HIGH (ref 0–99)
Triglycerides: 70 mg/dL (ref 0–149)
VLDL Cholesterol Cal: 11 mg/dL (ref 5–40)

## 2024-06-04 LAB — CBC WITH DIFFERENTIAL/PLATELET
Basophils Absolute: 0 10*3/uL (ref 0.0–0.2)
Basos: 1 %
EOS (ABSOLUTE): 0.2 10*3/uL (ref 0.0–0.4)
Eos: 5 %
Hematocrit: 38.6 % (ref 34.0–46.6)
Hemoglobin: 11.6 g/dL (ref 11.1–15.9)
Immature Grans (Abs): 0 10*3/uL (ref 0.0–0.1)
Immature Granulocytes: 0 %
Lymphocytes Absolute: 1.8 10*3/uL (ref 0.7–3.1)
Lymphs: 44 %
MCH: 22.1 pg — ABNORMAL LOW (ref 26.6–33.0)
MCHC: 30.1 g/dL — ABNORMAL LOW (ref 31.5–35.7)
MCV: 73 fL — ABNORMAL LOW (ref 79–97)
Monocytes Absolute: 0.3 10*3/uL (ref 0.1–0.9)
Monocytes: 7 %
Neutrophils Absolute: 1.8 10*3/uL (ref 1.4–7.0)
Neutrophils: 43 %
Platelets: 297 10*3/uL (ref 150–450)
RBC: 5.26 x10E6/uL (ref 3.77–5.28)
RDW: 17 % — ABNORMAL HIGH (ref 11.7–15.4)
WBC: 4.1 10*3/uL (ref 3.4–10.8)

## 2024-06-04 LAB — HEMOGLOBIN A1C
Est. average glucose Bld gHb Est-mCnc: 120 mg/dL
Hgb A1c MFr Bld: 5.8 % — ABNORMAL HIGH (ref 4.8–5.6)

## 2024-06-04 LAB — VITAMIN D 25 HYDROXY (VIT D DEFICIENCY, FRACTURES): Vit D, 25-Hydroxy: 57.4 ng/mL (ref 30.0–100.0)

## 2024-06-10 ENCOUNTER — Ambulatory Visit: Payer: Self-pay | Admitting: Nurse Practitioner

## 2024-06-11 NOTE — Assessment & Plan Note (Signed)
 No changes recommended at this time. Excellent exercise routine.

## 2024-06-11 NOTE — Assessment & Plan Note (Signed)
 Experiencing hot flashes with some hair thinning, possibly due to amitriptyline . No significant vaginal dryness. Symptoms are consistent with menopause and not causing significant distress. - Discuss lifestyle modifications if symptoms worsen.

## 2024-06-11 NOTE — Assessment & Plan Note (Signed)

## 2024-06-11 NOTE — Assessment & Plan Note (Signed)
 Repeat labs today

## 2025-06-22 ENCOUNTER — Encounter: Payer: Self-pay | Admitting: Nurse Practitioner
